# Patient Record
Sex: Female | Born: 1990 | Race: White | Hispanic: No | Marital: Single | Smoking: Current every day smoker
Health system: Southern US, Community
[De-identification: ages and names within clinical notes are randomized; demographics above are authoritative.]

## PROBLEM LIST (undated history)

## (undated) DIAGNOSIS — F99 Mental disorder, not otherwise specified: Secondary | ICD-10-CM

## (undated) DIAGNOSIS — B182 Chronic viral hepatitis C: Secondary | ICD-10-CM

## (undated) DIAGNOSIS — F1910 Other psychoactive substance abuse, uncomplicated: Secondary | ICD-10-CM

## (undated) DIAGNOSIS — F32A Depression, unspecified: Secondary | ICD-10-CM

## (undated) DIAGNOSIS — R569 Unspecified convulsions: Secondary | ICD-10-CM

## (undated) DIAGNOSIS — F329 Major depressive disorder, single episode, unspecified: Secondary | ICD-10-CM

## (undated) DIAGNOSIS — F1010 Alcohol abuse, uncomplicated: Secondary | ICD-10-CM

---

## 2008-02-16 ENCOUNTER — Emergency Department (HOSPITAL_BASED_OUTPATIENT_CLINIC_OR_DEPARTMENT_OTHER): Admission: EM | Admit: 2008-02-16 | Discharge: 2008-02-16 | Payer: Self-pay | Admitting: Emergency Medicine

## 2008-04-08 ENCOUNTER — Emergency Department (HOSPITAL_COMMUNITY): Admission: EM | Admit: 2008-04-08 | Discharge: 2008-04-08 | Payer: Self-pay | Admitting: Emergency Medicine

## 2008-04-22 ENCOUNTER — Emergency Department (HOSPITAL_COMMUNITY): Admission: EM | Admit: 2008-04-22 | Discharge: 2008-04-22 | Payer: Self-pay | Admitting: Emergency Medicine

## 2008-05-05 ENCOUNTER — Emergency Department (HOSPITAL_COMMUNITY): Admission: EM | Admit: 2008-05-05 | Discharge: 2008-05-05 | Payer: Self-pay | Admitting: Emergency Medicine

## 2008-05-31 ENCOUNTER — Emergency Department (HOSPITAL_COMMUNITY): Admission: EM | Admit: 2008-05-31 | Discharge: 2008-05-31 | Payer: Self-pay | Admitting: Emergency Medicine

## 2008-09-11 ENCOUNTER — Emergency Department (HOSPITAL_BASED_OUTPATIENT_CLINIC_OR_DEPARTMENT_OTHER): Admission: EM | Admit: 2008-09-11 | Discharge: 2008-09-12 | Payer: Self-pay | Admitting: Emergency Medicine

## 2008-09-20 ENCOUNTER — Emergency Department (HOSPITAL_BASED_OUTPATIENT_CLINIC_OR_DEPARTMENT_OTHER): Admission: EM | Admit: 2008-09-20 | Discharge: 2008-09-20 | Payer: Self-pay | Admitting: Emergency Medicine

## 2008-10-30 ENCOUNTER — Emergency Department (HOSPITAL_BASED_OUTPATIENT_CLINIC_OR_DEPARTMENT_OTHER): Admission: EM | Admit: 2008-10-30 | Discharge: 2008-10-30 | Payer: Self-pay | Admitting: Emergency Medicine

## 2009-01-21 ENCOUNTER — Ambulatory Visit: Payer: Self-pay | Admitting: Interventional Radiology

## 2009-01-21 ENCOUNTER — Emergency Department (HOSPITAL_BASED_OUTPATIENT_CLINIC_OR_DEPARTMENT_OTHER): Admission: EM | Admit: 2009-01-21 | Discharge: 2009-01-21 | Payer: Self-pay | Admitting: Emergency Medicine

## 2009-04-22 ENCOUNTER — Emergency Department (HOSPITAL_BASED_OUTPATIENT_CLINIC_OR_DEPARTMENT_OTHER): Admission: EM | Admit: 2009-04-22 | Discharge: 2009-04-23 | Payer: Self-pay | Admitting: Emergency Medicine

## 2009-04-23 ENCOUNTER — Ambulatory Visit: Payer: Self-pay | Admitting: Diagnostic Radiology

## 2009-05-06 ENCOUNTER — Ambulatory Visit: Payer: Self-pay | Admitting: Diagnostic Radiology

## 2009-05-06 ENCOUNTER — Emergency Department (HOSPITAL_BASED_OUTPATIENT_CLINIC_OR_DEPARTMENT_OTHER): Admission: EM | Admit: 2009-05-06 | Discharge: 2009-05-06 | Payer: Self-pay | Admitting: Emergency Medicine

## 2009-05-21 ENCOUNTER — Emergency Department (HOSPITAL_BASED_OUTPATIENT_CLINIC_OR_DEPARTMENT_OTHER): Admission: EM | Admit: 2009-05-21 | Discharge: 2009-05-21 | Payer: Self-pay | Admitting: Emergency Medicine

## 2009-05-21 ENCOUNTER — Ambulatory Visit: Payer: Self-pay | Admitting: Diagnostic Radiology

## 2009-06-02 ENCOUNTER — Emergency Department (HOSPITAL_COMMUNITY): Admission: EM | Admit: 2009-06-02 | Discharge: 2009-06-02 | Payer: Self-pay | Admitting: Emergency Medicine

## 2009-07-16 ENCOUNTER — Emergency Department (HOSPITAL_BASED_OUTPATIENT_CLINIC_OR_DEPARTMENT_OTHER): Admission: EM | Admit: 2009-07-16 | Discharge: 2009-07-17 | Payer: Self-pay | Admitting: Emergency Medicine

## 2009-07-17 ENCOUNTER — Other Ambulatory Visit: Payer: Self-pay | Admitting: Emergency Medicine

## 2009-07-18 ENCOUNTER — Inpatient Hospital Stay (HOSPITAL_COMMUNITY): Admission: AD | Admit: 2009-07-18 | Discharge: 2009-07-23 | Payer: Self-pay | Admitting: Psychiatry

## 2009-07-18 ENCOUNTER — Ambulatory Visit: Payer: Self-pay | Admitting: Psychiatry

## 2010-05-13 LAB — BASIC METABOLIC PANEL
BUN: 17 mg/dL (ref 6–23)
Calcium: 9.1 mg/dL (ref 8.4–10.5)
Creatinine, Ser: 0.9 mg/dL (ref 0.4–1.2)
Potassium: 4.1 mEq/L (ref 3.5–5.1)

## 2010-05-13 LAB — URINE CULTURE

## 2010-05-13 LAB — POCT I-STAT, CHEM 8
Calcium, Ion: 1.09 mmol/L — ABNORMAL LOW (ref 1.12–1.32)
Chloride: 107 mEq/L (ref 96–112)
Creatinine, Ser: 0.7 mg/dL (ref 0.4–1.2)
Glucose, Bld: 87 mg/dL (ref 70–99)
Potassium: 3.9 mEq/L (ref 3.5–5.1)

## 2010-05-13 LAB — POCT TOXICOLOGY PANEL
Cocaine: POSITIVE
Tetrahydrocannabinol: POSITIVE

## 2010-05-13 LAB — URINALYSIS, ROUTINE W REFLEX MICROSCOPIC
Bilirubin Urine: NEGATIVE
Bilirubin Urine: NEGATIVE
Hgb urine dipstick: NEGATIVE
Hgb urine dipstick: NEGATIVE
Ketones, ur: 15 mg/dL — AB
Specific Gravity, Urine: 1.034 — ABNORMAL HIGH (ref 1.005–1.030)
Specific Gravity, Urine: 1.015 (ref 1.005–1.030)
Urobilinogen, UA: 0.2 mg/dL (ref 0.0–1.0)
pH: 6 (ref 5.0–8.0)

## 2010-05-13 LAB — URINE MICROSCOPIC-ADD ON

## 2010-05-13 LAB — CBC
HCT: 41.5 % (ref 36.0–46.0)
RBC: 4.35 MIL/uL (ref 3.87–5.11)
RDW: 12.4 % (ref 11.5–15.5)
WBC: 8.9 10*3/uL (ref 4.0–10.5)

## 2010-05-13 LAB — POCT PREGNANCY, URINE: Preg Test, Ur: NEGATIVE

## 2010-05-15 LAB — DIFFERENTIAL
Basophils Absolute: 0 10*3/uL (ref 0.0–0.1)
Basophils Relative: 0 % (ref 0–1)
Eosinophils Absolute: 0.1 10*3/uL (ref 0.0–0.7)
Eosinophils Relative: 1 % (ref 0–5)
Neutrophils Relative %: 71 % (ref 43–77)

## 2010-05-15 LAB — COMPREHENSIVE METABOLIC PANEL
ALT: 15 U/L (ref 0–35)
Albumin: 4.2 g/dL (ref 3.5–5.2)
Chloride: 101 mEq/L (ref 96–112)
Creatinine, Ser: 0.84 mg/dL (ref 0.4–1.2)
Sodium: 136 mEq/L (ref 135–145)
Total Protein: 7.1 g/dL (ref 6.0–8.3)

## 2010-05-15 LAB — CBC
HCT: 38.5 % (ref 36.0–46.0)
Hemoglobin: 13.1 g/dL (ref 12.0–15.0)
MCHC: 34.1 g/dL (ref 30.0–36.0)
MCV: 97.7 fL (ref 78.0–100.0)
Platelets: 247 10*3/uL (ref 150–400)
RBC: 3.94 MIL/uL (ref 3.87–5.11)

## 2010-05-15 LAB — URINE CULTURE: Colony Count: >=100000

## 2010-05-15 LAB — URINALYSIS, ROUTINE W REFLEX MICROSCOPIC
Glucose, UA: NEGATIVE mg/dL
Hgb urine dipstick: NEGATIVE
Ketones, ur: 40 mg/dL — AB
Protein, ur: 30 mg/dL — AB
Urobilinogen, UA: 1 mg/dL (ref 0.0–1.0)

## 2010-05-15 LAB — POCT PREGNANCY, URINE: Preg Test, Ur: NEGATIVE

## 2010-05-15 LAB — URINE MICROSCOPIC-ADD ON

## 2010-05-31 LAB — URINALYSIS, ROUTINE W REFLEX MICROSCOPIC
Bilirubin Urine: NEGATIVE
Glucose, UA: NEGATIVE mg/dL
Ketones, ur: NEGATIVE mg/dL
Leukocytes, UA: NEGATIVE
Protein, ur: NEGATIVE mg/dL
pH: 5.5 (ref 5.0–8.0)

## 2010-05-31 LAB — URINE MICROSCOPIC-ADD ON

## 2010-06-02 LAB — DIFFERENTIAL
Basophils Absolute: 0 10*3/uL (ref 0.0–0.1)
Lymphocytes Relative: 27 % (ref 12–46)
Lymphs Abs: 2.8 10*3/uL (ref 0.7–4.0)
Monocytes Absolute: 0.8 10*3/uL (ref 0.1–1.0)
Neutro Abs: 6.5 10*3/uL (ref 1.7–7.7)

## 2010-06-02 LAB — BASIC METABOLIC PANEL
Calcium: 8.9 mg/dL (ref 8.4–10.5)
GFR calc non Af Amer: >60 mL/min (ref >60)
Glucose, Bld: 102 mg/dL — ABNORMAL HIGH (ref 70–99)
Sodium: 141 mEq/L (ref 135–145)

## 2010-06-02 LAB — URINALYSIS, ROUTINE W REFLEX MICROSCOPIC
Bilirubin Urine: NEGATIVE
Hgb urine dipstick: NEGATIVE
Nitrite: NEGATIVE
Specific Gravity, Urine: 1.025 (ref 1.005–1.030)
Specific Gravity, Urine: 1.034 — ABNORMAL HIGH (ref 1.005–1.030)
Urobilinogen, UA: 1 mg/dL (ref 0.0–1.0)
pH: 6 (ref 5.0–8.0)
pH: 7 (ref 5.0–8.0)

## 2010-06-02 LAB — CBC
Hemoglobin: 13.5 g/dL (ref 12.0–15.0)
Platelets: 222 10*3/uL (ref 150–400)
RDW: 12 % (ref 11.5–15.5)
WBC: 10.3 10*3/uL (ref 4.0–10.5)

## 2010-06-02 LAB — RPR: RPR Ser Ql: NONREACTIVE

## 2010-06-02 LAB — GC/CHLAMYDIA PROBE AMP, GENITAL: GC Probe Amp, Genital: NEGATIVE

## 2010-06-02 LAB — WET PREP, GENITAL: Yeast Wet Prep HPF POC: NONE SEEN

## 2010-06-02 LAB — PREGNANCY, URINE: Preg Test, Ur: NEGATIVE

## 2013-03-29 ENCOUNTER — Encounter (HOSPITAL_COMMUNITY): Payer: Self-pay | Admitting: Emergency Medicine

## 2013-03-29 ENCOUNTER — Emergency Department (HOSPITAL_COMMUNITY)
Admission: EM | Admit: 2013-03-29 | Discharge: 2013-03-30 | Disposition: A | Discharge status: Other Acute Inpatient Hospital | Payer: Medicaid Other | Source: Home / Self Care | Attending: Emergency Medicine | Admitting: Emergency Medicine

## 2013-03-29 DIAGNOSIS — B182 Chronic viral hepatitis C: Secondary | ICD-10-CM | POA: Diagnosis present

## 2013-03-29 DIAGNOSIS — F411 Generalized anxiety disorder: Secondary | ICD-10-CM | POA: Diagnosis present

## 2013-03-29 DIAGNOSIS — Z23 Encounter for immunization: Secondary | ICD-10-CM

## 2013-03-29 DIAGNOSIS — Z8619 Personal history of other infectious and parasitic diseases: Secondary | ICD-10-CM

## 2013-03-29 DIAGNOSIS — F17200 Nicotine dependence, unspecified, uncomplicated: Secondary | ICD-10-CM

## 2013-03-29 DIAGNOSIS — Z833 Family history of diabetes mellitus: Secondary | ICD-10-CM

## 2013-03-29 DIAGNOSIS — F1120 Opioid dependence, uncomplicated: Principal | ICD-10-CM | POA: Diagnosis present

## 2013-03-29 DIAGNOSIS — Z880 Allergy status to penicillin: Secondary | ICD-10-CM

## 2013-03-29 DIAGNOSIS — F329 Major depressive disorder, single episode, unspecified: Secondary | ICD-10-CM | POA: Diagnosis present

## 2013-03-29 DIAGNOSIS — F1110 Opioid abuse, uncomplicated: Secondary | ICD-10-CM | POA: Insufficient documentation

## 2013-03-29 DIAGNOSIS — Z3202 Encounter for pregnancy test, result negative: Secondary | ICD-10-CM

## 2013-03-29 DIAGNOSIS — F1910 Other psychoactive substance abuse, uncomplicated: Secondary | ICD-10-CM

## 2013-03-29 DIAGNOSIS — F1010 Alcohol abuse, uncomplicated: Secondary | ICD-10-CM

## 2013-03-29 DIAGNOSIS — F1210 Cannabis abuse, uncomplicated: Secondary | ICD-10-CM | POA: Diagnosis present

## 2013-03-29 DIAGNOSIS — F1994 Other psychoactive substance use, unspecified with psychoactive substance-induced mood disorder: Secondary | ICD-10-CM | POA: Diagnosis present

## 2013-03-29 DIAGNOSIS — F1020 Alcohol dependence, uncomplicated: Secondary | ICD-10-CM | POA: Diagnosis present

## 2013-03-29 DIAGNOSIS — G4700 Insomnia, unspecified: Secondary | ICD-10-CM | POA: Diagnosis present

## 2013-03-29 DIAGNOSIS — F1410 Cocaine abuse, uncomplicated: Secondary | ICD-10-CM | POA: Diagnosis present

## 2013-03-29 DIAGNOSIS — F410 Panic disorder [episodic paroxysmal anxiety] without agoraphobia: Secondary | ICD-10-CM | POA: Diagnosis present

## 2013-03-29 DIAGNOSIS — F4310 Post-traumatic stress disorder, unspecified: Secondary | ICD-10-CM | POA: Diagnosis present

## 2013-03-29 HISTORY — DX: Alcohol abuse, uncomplicated: F10.10

## 2013-03-29 HISTORY — DX: Chronic viral hepatitis C: B18.2

## 2013-03-29 HISTORY — DX: Depression, unspecified: F32.A

## 2013-03-29 HISTORY — DX: Mental disorder, not otherwise specified: F99

## 2013-03-29 HISTORY — DX: Other psychoactive substance abuse, uncomplicated: F19.10

## 2013-03-29 HISTORY — DX: Major depressive disorder, single episode, unspecified: F32.9

## 2013-03-29 LAB — COMPREHENSIVE METABOLIC PANEL
ALBUMIN: 4.1 g/dL (ref 3.5–5.2)
ALK PHOS: 64 U/L (ref 39–117)
ALT: 40 U/L — AB (ref 0–35)
AST: 28 U/L (ref 0–37)
BUN: 6 mg/dL (ref 6–23)
CALCIUM: 9.4 mg/dL (ref 8.4–10.5)
CO2: 29 mEq/L (ref 19–32)
Chloride: 95 mEq/L — ABNORMAL LOW (ref 96–112)
Creatinine, Ser: 0.7 mg/dL (ref 0.50–1.10)
GFR calc non Af Amer: >90 mL/min (ref >90)
GLUCOSE: 93 mg/dL (ref 70–99)
POTASSIUM: 3.5 meq/L — AB (ref 3.7–5.3)
SODIUM: 137 meq/L (ref 137–147)
TOTAL PROTEIN: 7.5 g/dL (ref 6.0–8.3)
Total Bilirubin: 0.8 mg/dL (ref 0.3–1.2)

## 2013-03-29 LAB — RAPID URINE DRUG SCREEN, HOSP PERFORMED
Amphetamines: NOT DETECTED
BARBITURATES: NOT DETECTED
BENZODIAZEPINES: NOT DETECTED
COCAINE: NOT DETECTED
OPIATES: POSITIVE — AB
TETRAHYDROCANNABINOL: POSITIVE — AB

## 2013-03-29 LAB — ACETAMINOPHEN LEVEL

## 2013-03-29 LAB — CBC
HCT: 39.2 % (ref 36.0–46.0)
Hemoglobin: 13.8 g/dL (ref 12.0–15.0)
MCH: 32.3 pg (ref 26.0–34.0)
MCHC: 35.2 g/dL (ref 30.0–36.0)
MCV: 91.8 fL (ref 78.0–100.0)
Platelets: 265 10*3/uL (ref 150–400)
RBC: 4.27 MIL/uL (ref 3.87–5.11)
RDW: 12 % (ref 11.5–15.5)
WBC: 9.5 10*3/uL (ref 4.0–10.5)

## 2013-03-29 LAB — ETHANOL

## 2013-03-29 LAB — SALICYLATE LEVEL: Salicylate Lvl: <2 mg/dL — ABNORMAL LOW (ref 2.8–20.0)

## 2013-03-29 LAB — POCT PREGNANCY, URINE: Preg Test, Ur: NEGATIVE

## 2013-03-29 MED ORDER — ALUM & MAG HYDROXIDE-SIMETH 200-200-20 MG/5ML PO SUSP: 30.0000 mL | ORAL | Status: DC | PRN

## 2013-03-29 MED ORDER — ZOLPIDEM TARTRATE 5 MG PO TABS: 5.0000 mg | ORAL_TABLET | Freq: Every evening | ORAL | Status: DC | PRN

## 2013-03-29 MED ORDER — LORAZEPAM 1 MG PO TABS: 1.0000 mg | ORAL_TABLET | Freq: Three times a day (TID) | ORAL | Status: DC | PRN

## 2013-03-29 MED ORDER — NICOTINE 21 MG/24HR TD PT24
21.0000 mg | MEDICATED_PATCH | Freq: Every day | TRANSDERMAL | Status: DC
  Administered 2013-03-29: 21 mg via TRANSDERMAL
  Filled 2013-03-29: qty 1

## 2013-03-29 MED ORDER — ACETAMINOPHEN 325 MG PO TABS: 650.0000 mg | ORAL_TABLET | ORAL | Status: DC | PRN

## 2013-03-29 MED ORDER — ONDANSETRON HCL 4 MG PO TABS: 4.0000 mg | ORAL_TABLET | Freq: Three times a day (TID) | ORAL | Status: DC | PRN

## 2013-03-29 MED ORDER — IBUPROFEN 200 MG PO TABS: 600.0000 mg | ORAL_TABLET | Freq: Three times a day (TID) | ORAL | Status: DC | PRN

## 2013-03-29 NOTE — Progress Notes (Signed)
   CARE MANAGEMENT ED NOTE 03/29/2013  Patient:  Epimenio FootRESNELL,Nazariah   Account Number:  [id number]  Date Initiated:  03/29/2013  Documentation initiated by:  Radford PaxFERRERO,Mallarie Voorhies  Subjective/Objective Assessment:   Patient presents to Ed with requesting detox from heroine.     Subjective/Objective Assessment Detail:   Pmhx of ETOH abuse and Hep C     Action/Plan:   Action/Plan Detail:   Anticipated DC Date:       Status Recommendation to Physician:   Result of Recommendation:    Other ED Services  Consult Working Plan    DC Planning Services  Other  PCP issues    Choice offered to / List presented to:            Status of service:  Completed, signed off  ED Comments:   ED Comments Detail:  Patient confirms she does not have a pcp or insurance. [facility] provided patient with a list of pcps who accept self pay patients, information regarding Medicaid and Affordable Care Act for insurance, number to inquire about the orange card (of which the patient has had in the past), list of discounted pharmacies and website needymeds.org for medication assistances, financila assistance in the community sucha as local churches and salvation army, urban ministries, and dental assistance for patients without insurance.  Patient thankful for resources.  No further EDCM need at this time.
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
## 2013-03-29 NOTE — ED Notes (Signed)
Pt has been changed and wanded by security.  

## 2013-03-29 NOTE — BH Assessment (Signed)
Consulted with EDP Dr. Allen to obtain clinicals prior to assessing patient.   Cystal Shannahan, MS, LCASA Assessment Counselor  

## 2013-03-29 NOTE — ED Provider Notes (Signed)
Medical screening examination/treatment/procedure(s) were performed by non-physician practitioner and as supervising physician I was immediately available for consultation/collaboration.    Celene KrasJon R Veola Cafaro, MD 03/29/13 2013

## 2013-03-29 NOTE — ED Provider Notes (Signed)
CSN: [id number]     Arrival date & time 03/29/13  1642 History   This chart was scribed for non-physician practitioner Junious Silk PA-C, working with Celene Kras, MD by Donne Anon, ED Scribe. This patient was seen in room [facility]TR4/[facility] and the patient's care was started at 1646.   First MD Initiated Contact with Patient 03/29/13 1646     Chief Complaint  Patient presents with  . Medical Clearance    requesting detox from heroin    The history is provided by the patient. No language interpreter was used.   HPI Comments: Paula Stephenson is a 23 y.o. female who presents to the Emergency Department requiring medical clearance. She is requesting detox from  heroin. She currently uses "quite a bit" of heroin 3-4x per day. She has been using heroin for 4 years. Her last detox from heroin was in May 2013 and was sober for 15 months. She also currently consumes alcohol, and her last drink was this morning. She normally drinks beer, and states she only occasionally uses it. She also smokes 1 pack of cigarettes per day.She denies SI or HI. She is seeking inpatient detox.     Past Medical History  Diagnosis Date  . Alcohol abuse   . Substance abuse   . Hep C w/o coma, chronic    History reviewed. No pertinent past surgical history. Family History  Problem Relation Age of Onset  . Diabetes Other   . Cancer Other    History  Substance Use Topics  . Smoking status: Current Every Day Smoker    Types: Cigarettes  . Smokeless tobacco: Not on file  . Alcohol Use: Yes   OB History   Grav Para Term Preterm Abortions TAB SAB Ect Mult Living                 Review of Systems  Psychiatric/Behavioral: Negative for suicidal ideas.       Substance abuse  All other systems reviewed and are negative.    Allergies  Penicillins  Home Medications  No current outpatient prescriptions on file.  BP 117/72  Pulse 97  Temp(Src) 98.4 F (36.9 C) (Oral)  Ht 4\' 11"  (1.499 m)  Wt 110 lb (49.896  kg)  BMI 22.21 kg/m2  SpO2 100%  LMP 03/29/2013  Physical Exam  Nursing note and vitals reviewed. Constitutional: She is oriented to person, place, and time. She appears well-developed and well-nourished. No distress.  HENT:  Head: Normocephalic and atraumatic.  Right Ear: External ear normal.  Left Ear: External ear normal.  Nose: Nose normal.  Mouth/Throat: Oropharynx is clear and moist.  Eyes: Conjunctivae are normal.  Neck: Normal range of motion.  Cardiovascular: Normal rate, regular rhythm and normal heart sounds.   Pulmonary/Chest: Effort normal and breath sounds normal. No stridor. No respiratory distress. She has no wheezes. She has no rales.  Abdominal: Soft. She exhibits no distension.  Musculoskeletal: Normal range of motion.  Neurological: She is alert and oriented to person, place, and time. She has normal strength.  Skin: Skin is warm and dry. She is not diaphoretic. No erythema.  Psychiatric: Her behavior is normal. Her affect is blunt. She expresses no homicidal and no suicidal ideation.    ED Course  Procedures (including critical care time) DIAGNOSTIC STUDIES: Oxygen Saturation is 100% on RA, normal by my interpretation.    COORDINATION OF CARE: 6:31 PM Discussed treatment plan which includes labs and consult with ACT team with pt at bedside  and pt agreed to plan.    Labs Review Labs Reviewed  COMPREHENSIVE METABOLIC PANEL - Abnormal; Notable for the following:    Potassium 3.5 (*)    Chloride 95 (*)    ALT 40 (*)    All other components within normal limits  SALICYLATE LEVEL - Abnormal; Notable for the following:    Salicylate Lvl <2.0 (*)    All other components within normal limits  URINE RAPID DRUG SCREEN (HOSP PERFORMED) - Abnormal; Notable for the following:    Opiates POSITIVE (*)    Tetrahydrocannabinol POSITIVE (*)    All other components within normal limits  ACETAMINOPHEN LEVEL  CBC  ETHANOL  POCT PREGNANCY, URINE   Imaging Review No  results found.  EKG Interpretation   None       MDM  No diagnosis found.  Patient presents to ED for detox from heroin. Patient reports she has failed outpatient detox and is requesting inpatient. Last time she was able to stay sober for 15 months. Will consult TTS to see if patient is candidate for inpatient detox. If not, patient may be d/c'd home. No SI/HI. Vital signs stable.   I personally performed the services described in this documentation, which was scribed in my presence. The recorded information has been reviewed and is accurate.     Mora BellmanHannah S Van Seymore, PA-C 03/29/13 2010

## 2013-03-29 NOTE — ED Notes (Signed)
Pt was brought to ED by friend,. Pt is AO x 3, and cooperative. Pt is requesting detox from all substances currently in her system,opiates, marijuana, alcohol, heroin.

## 2013-03-30 ENCOUNTER — Encounter (HOSPITAL_COMMUNITY): Payer: Self-pay | Admitting: Behavioral Health

## 2013-03-30 ENCOUNTER — Inpatient Hospital Stay (HOSPITAL_COMMUNITY)
Admission: AD | Admit: 2013-03-30 | Discharge: 2013-04-01 | DRG: 897 | Disposition: A | Discharge status: Home / Self Care | Payer: Medicaid Other | Source: Intra-hospital | Attending: Psychiatry | Admitting: Psychiatry

## 2013-03-30 DIAGNOSIS — F1120 Opioid dependence, uncomplicated: Secondary | ICD-10-CM

## 2013-03-30 DIAGNOSIS — F329 Major depressive disorder, single episode, unspecified: Secondary | ICD-10-CM | POA: Diagnosis present

## 2013-03-30 DIAGNOSIS — F411 Generalized anxiety disorder: Secondary | ICD-10-CM

## 2013-03-30 DIAGNOSIS — F3289 Other specified depressive episodes: Secondary | ICD-10-CM

## 2013-03-30 MED ORDER — HYDROXYZINE HCL 25 MG PO TABS: 25.0000 mg | ORAL_TABLET | Freq: Four times a day (QID) | ORAL | Status: DC | PRN

## 2013-03-30 MED ORDER — TRAZODONE HCL 100 MG PO TABS
100.0000 mg | ORAL_TABLET | Freq: Every evening | ORAL | Status: DC | PRN
  Administered 2013-03-30 – 2013-03-31 (×4): 100 mg via ORAL
  Filled 2013-03-30: qty 1
  Filled 2013-03-30: qty 28
  Filled 2013-03-30 (×2): qty 1
  Filled 2013-03-30: qty 28
  Filled 2013-03-30 (×6): qty 1

## 2013-03-30 MED ORDER — NAPROXEN 500 MG PO TABS
500.0000 mg | ORAL_TABLET | Freq: Two times a day (BID) | ORAL | Status: DC | PRN
  Administered 2013-03-30: 500 mg via ORAL
  Filled 2013-03-30: qty 1

## 2013-03-30 MED ORDER — CHLORDIAZEPOXIDE HCL 25 MG PO CAPS
25.0000 mg | ORAL_CAPSULE | Freq: Four times a day (QID) | ORAL | Status: DC | PRN
  Administered 2013-03-30 – 2013-03-31 (×2): 25 mg via ORAL
  Filled 2013-03-30 (×2): qty 1

## 2013-03-30 MED ORDER — ONDANSETRON 4 MG PO TBDP: 4.0000 mg | ORAL_TABLET | Freq: Four times a day (QID) | ORAL | Status: DC | PRN

## 2013-03-30 MED ORDER — METHOCARBAMOL 500 MG PO TABS
500.0000 mg | ORAL_TABLET | Freq: Three times a day (TID) | ORAL | Status: DC | PRN
  Administered 2013-03-31: 500 mg via ORAL
  Filled 2013-03-30: qty 1

## 2013-03-30 MED ORDER — CLONIDINE HCL 0.1 MG PO TABS
0.1000 mg | ORAL_TABLET | ORAL | Status: DC
  Filled 2013-03-30 (×4): qty 1

## 2013-03-30 MED ORDER — PNEUMOCOCCAL VAC POLYVALENT 25 MCG/0.5ML IJ INJ
0.5000 mL | INJECTION | INTRAMUSCULAR | Status: AC
  Administered 2013-03-31: 0.5 mL via INTRAMUSCULAR

## 2013-03-30 MED ORDER — LOPERAMIDE HCL 2 MG PO CAPS: 2.0000 mg | ORAL_CAPSULE | ORAL | Status: DC | PRN

## 2013-03-30 MED ORDER — CLONIDINE HCL 0.1 MG PO TABS
0.1000 mg | ORAL_TABLET | Freq: Four times a day (QID) | ORAL | Status: DC
  Administered 2013-03-30 – 2013-04-01 (×7): 0.1 mg via ORAL
  Filled 2013-03-30 (×10): qty 1

## 2013-03-30 MED ORDER — PROSIGHT PO TABS
1.0000 | ORAL_TABLET | Freq: Every day | ORAL | Status: DC
  Administered 2013-03-30 – 2013-04-01 (×3): 1 via ORAL
  Filled 2013-03-30 (×5): qty 1

## 2013-03-30 MED ORDER — ACETAMINOPHEN 325 MG PO TABS: 650.0000 mg | ORAL_TABLET | Freq: Four times a day (QID) | ORAL | Status: DC | PRN

## 2013-03-30 MED ORDER — MAGNESIUM HYDROXIDE 400 MG/5ML PO SUSP: 30.0000 mL | Freq: Every day | ORAL | Status: DC | PRN

## 2013-03-30 MED ORDER — SERTRALINE HCL 25 MG PO TABS
25.0000 mg | ORAL_TABLET | Freq: Every day | ORAL | Status: DC
  Administered 2013-03-30 – 2013-04-01 (×3): 25 mg via ORAL
  Filled 2013-03-30: qty 14
  Filled 2013-03-30 (×5): qty 1

## 2013-03-30 MED ORDER — TRAZODONE HCL 50 MG PO TABS
50.0000 mg | ORAL_TABLET | Freq: Every evening | ORAL | Status: DC | PRN
  Administered 2013-03-30: 50 mg via ORAL
  Filled 2013-03-30 (×5): qty 1

## 2013-03-30 MED ORDER — DICYCLOMINE HCL 20 MG PO TABS: 20.0000 mg | ORAL_TABLET | Freq: Four times a day (QID) | ORAL | Status: DC | PRN

## 2013-03-30 MED ORDER — NICOTINE 21 MG/24HR TD PT24
21.0000 mg | MEDICATED_PATCH | Freq: Every day | TRANSDERMAL | Status: DC
  Administered 2013-03-30 – 2013-03-31 (×2): 21 mg via TRANSDERMAL
  Filled 2013-03-30 (×5): qty 1

## 2013-03-30 MED ORDER — INFLUENZA VAC SPLIT QUAD 0.5 ML IM SUSP
0.5000 mL | INTRAMUSCULAR | Status: AC
  Administered 2013-03-31: 0.5 mL via INTRAMUSCULAR
  Filled 2013-03-30: qty 0.5

## 2013-03-30 MED ORDER — CLONIDINE HCL 0.1 MG PO TABS: 0.1000 mg | ORAL_TABLET | Freq: Every day | ORAL | Status: DC

## 2013-03-30 MED ORDER — GABAPENTIN 100 MG PO CAPS
100.0000 mg | ORAL_CAPSULE | Freq: Three times a day (TID) | ORAL | Status: DC
  Administered 2013-03-30 – 2013-04-01 (×5): 100 mg via ORAL
  Filled 2013-03-30 (×8): qty 1
  Filled 2013-03-30 (×2): qty 42
  Filled 2013-03-30: qty 1
  Filled 2013-03-30: qty 42

## 2013-03-30 MED ORDER — OCUVITE-LUTEIN PO CAPS: 1.0000 | ORAL_CAPSULE | Freq: Every day | ORAL | Status: DC

## 2013-03-30 MED ORDER — ALUM & MAG HYDROXIDE-SIMETH 200-200-20 MG/5ML PO SUSP: 30.0000 mL | ORAL | Status: DC | PRN

## 2013-03-30 MED ORDER — ENSURE COMPLETE PO LIQD
237.0000 mL | Freq: Two times a day (BID) | ORAL | Status: DC
  Administered 2013-03-30: 237 mL via ORAL

## 2013-03-30 NOTE — [facility] Group Notes (Signed)
[facility] LCSW Group Therapy  03/30/2013 2:38 PM  Type of Therapy:  Group Therapy  Participation Level:  Active  Participation Quality:  Attentive  Affect:  Appropriate  Cognitive:  Alert and Oriented  Insight:  Engaged  Engagement in Therapy:  Engaged  Modes of Intervention:  Confrontation, Discussion, Education, Exploration, Problem-solving, Rapport Building, Socialization and Support  Summary of Progress/Problems: Emotion Regulation: This group focused on both positive and negative emotion identification and allowed group members to process ways to identify feelings, regulate negative emotions, and find healthy ways to manage internal/external emotions. Group members were asked to reflect on a time when their reaction to an emotion led to a negative outcome and explored how alternative responses using emotion regulation would have benefited them. Group members were also asked to discuss a time when emotion regulation was utilized when a negative emotion was experienced. Paula Stephenson was attentive and engaged throughout today's therapy group. She shared that she struggles with rejection and anger. CSW asked Paula Stephenson to identify and emotion tied to rejection--"grief." Paula Stephenson explained that she is grieving over the loss of her relationship with the father of her child. "My child looks just like him. I can't even be a good mother right now because I see my ex in my son." Paula Stephenson shows improving insight and progress in the group setting AEB her ability to process how keeping a journal (free writing) and talking to her support network rather than isolating or abusing heroin will help her cope with this loss and talk through her feelings of anger and rejection.    Smart, [name] LCSWA  03/30/2013, 2:38 PM

## 2013-03-30 NOTE — [facility] Suicide Risk Assessment (Signed)
[facility] INPATIENT: Family/Significant Other Suicide Prevention Education   Suicide Prevention Education:  Education Completed; No one has been identified by the patient as the family member/significant other with whom the patient will be residing, and identified as the person(s) who will aid the patient in the event of a mental health crisis (suicidal ideations/suicide attempt).   Pt did not c/o SI at admission, nor have they endorsed SI during their stay here. SPE not required. SPI pamphlet provided to pt and he was encouraged to share information with his support network, ask questions, and talk about any concerns.   The suicide prevention education provided includes the following:  Suicide risk factors  Suicide prevention and interventions  National Suicide Hotline telephone number  [facility] assessment telephone number  [facility] Emergency Assistance 911  [facility] and/or Residential Mobile Crisis Unit telephone number  Reyes IvanChelsea Horton, Kentucky[place]CS[place] 03/30/2013  2:40 PM

## 2013-03-30 NOTE — Tx Team (Signed)
Initial Interdisciplinary Treatment Plan  PATIENT STRENGTHS: (choose at least two) Ability for insight Active sense of humor Capable of independent living  PATIENT STRESSORS: Marital or family conflict Substance abuse   PROBLEM LIST: Problem List/Patient Goals Date to be addressed Date deferred Reason deferred Estimated date of resolution  Substance abuse 03/30/13     Recent break-up  03/30/13                                                DISCHARGE CRITERIA:  Ability to meet basic life and health needs Adequate post-discharge living arrangements Improved stabilization in mood, thinking, and/or behavior Verbal commitment to aftercare and medication compliance Withdrawal symptoms are absent or subacute and managed without 24-hour nursing intervention  PRELIMINARY DISCHARGE PLAN: Attend aftercare/continuing care group Attend PHP/IOP  PATIENT/FAMIILY INVOLVEMENT: This treatment plan has been presented to and reviewed with the patient, Paula Stephenson, and/or family member.  The patient and family have been given the opportunity to ask questions and make suggestions.  [name] L 03/30/2013, 2:20 AM

## 2013-03-30 NOTE — ED Notes (Signed)
No acute distress noted.

## 2013-03-30 NOTE — Progress Notes (Signed)
D: Patient denies SI/HI/AVH.  She is  flat and blunted but assertive.  She has been using the phone and appears appropriate. Pt. Reports poor sleep and appetite and per self inventory rates her depression as 8/10 but no feelings of hopelessness reported.  A: Patient given emotional support from RN. Patient encouraged to come to staff with concerns and/or questions. Patient's medication routine continued. Patient's orders and plan of care reviewed.   R: Patient remains appropriate and cooperative. Will continue to monitor patient q15 minutes for safety.

## 2013-03-30 NOTE — [facility] Group Notes (Signed)
[facility] L[facility] Aftercare Dis[facility] Planning [facility] Note   03/30/2013 9:45 AM  Participation Quality: Appropriate   Mood/Affect:  Depressed and Flat  Depression Rating:  6  Anxiety Rating:  7  Thoughts of Suicide:  No Will you contract for safety?   NA  Current AVH:  No  Plan for Discharge/Comments:  Pt reports that she lives with her mother. She plans to move in with her grandmother at d/c "it is a better environment for me." [place] and does not have MH provider at this time.   Transportation Means: family member  Supports: grandmother/limited family supports  Counselling psychologistmart, OncologistHeather LCSWA

## 2013-03-30 NOTE — [facility] Suicide Risk Assessment (Signed)
Suicide Risk Assessment  Admission Assessment     Nursing information obtained from:  Patient Demographic factors:  Caucasian;Low socioeconomic status;Unemployed Current Mental Status:  NA Loss Factors:  Loss of significant relationship;Financial problems / change in socioeconomic status Historical Factors:  NA Risk Reduction Factors:  Responsible for children under 25 years of age;Sense of responsibility to family;Positive social support Total Time spent with patient: 45 minutes  CLINICAL FACTORS:   Depression:   Comorbid alcohol abuse/dependence Impulsivity Alcohol/Substance Abuse/Dependencies  Psychiatric Specialty Exam:     Blood pressure 111/79, pulse 113, temperature 97.6 F (36.4 C), temperature source Oral, resp. rate 16, height 5' 1.02" (1.55 m), weight 56.246 kg (124 lb), last menstrual period 03/29/2013.Body mass index is 23.41 kg/(m^2).  General Appearance: Fairly Groomed  Patent attorneyye Contact::  Fair  Speech:  Clear and Coherent  Volume:  Decreased  Mood:  Anxious, Depressed and worried  Affect:  Restricted  Thought Process:  Coherent and Goal Directed  Orientation:  Full (Time, Place, and Person)  Thought Content:  symptoms, worries, concerns  Suicidal Thoughts:  No  Homicidal Thoughts:  No  Memory:  Immediate;   Fair Recent;   Fair Remote;   Fair  Judgement:  Fair  Insight:  Present  Psychomotor Activity:  Restlessness  Concentration:  Fair  Recall:  FiservFair  Fund of Knowledge:Fair  Language: Fair  Akathisia:  No  Handed:    AIMS (if indicated):     Assets:  Desire for Improvement  Sleep:  Number of Hours: 2.5   Musculoskeletal: Strength & Muscle Tone: within normal limits Gait & Station: normal Patient leans: N/A  COGNITIVE FEATURES THAT CONTRIBUTE TO RISK:  Closed-mindedness Polarized thinking Thought constriction (tunnel vision)    SUICIDE RISK:   Mild:  Suicidal ideation of limited frequency, intensity, duration, and specificity.  There are no  identifiable plans, no associated intent, mild dysphoria and related symptoms, good self-control (both objective and subjective assessment), few other risk factors, and identifiable protective factors, including available and accessible social support.  PLAN OF CARE: Supportive approach/coping skills/relaspe prevention                              Address the co morbidities                              Resume and optimize response to psychotropic agents  I certify that inpatient services furnished can reasonably be expected to improve the patient's condition.  Leman Martinek A 03/30/2013, 4:20 PM
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
## 2013-03-30 NOTE — Progress Notes (Signed)
NUTRITION ASSESSMENT  Pt identified as at risk on the Malnutrition Screen Tool  INTERVENTION: 1. Educated patient on the importance of nutrition and encouraged intake of food and beverages. 2. Discussed weight goals. 3. Supplements: Ensure Complete po BID, each supplement provides 350 kcal and 13 grams of protein and MVI daily  NUTRITION DIAGNOSIS: Unintentional weight loss related to sub-optimal intake as evidenced by pt report.   Goal: Pt to meet >/= 90% of their estimated nutrition needs.  Monitor:  PO intake  Assessment:  Patient admitted with etoh and heroine abuse.  Patient states that she has a variable appetite and that she had not eaten in the past 4 days secondary to poor appetite.  Reports gaining to 171 lbs while in prison with loss in the last 7 months since her release.  Patient reports using drugs during this time and not eating well.  Will go days without eating.    Patient meets criteria for severe malnutrition related to social/environmental causes AEB a 27% weight loss in the past 7 months and intake <50% for greater than one month.      23 y.o. female  Height: Ht Readings from Last 1 Encounters:  03/30/13 5' 1.02" (1.55 m)    Weight: Wt Readings from Last 1 Encounters:  03/30/13 124 lb (56.246 kg)    Weight Hx: Wt Readings from Last 10 Encounters:  03/30/13 124 lb (56.246 kg)  03/29/13 110 lb (49.896 kg)    BMI:  Body mass index is 23.41 kg/(m^2). Pt meets criteria for normal weight based on current BMI.  Estimated Nutritional Needs: Kcal: 25-30 kcal/kg Protein: > 1 gram protein/kg Fluid: 1 ml/kcal  Diet Order: General Pt is also offered choice of unit snacks mid-morning and mid-afternoon.  Pt is eating as desired.   Lab results and medications reviewed.   Oran ReinLaura Jobe, RD, LDN Clinical Inpatient Dietitian Pager:  [phone] Weekend and after hours pager:  [phone]
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
## 2013-03-30 NOTE — Progress Notes (Signed)
Recreation Therapy Notes  Date: 02.04.2015 Time: 2:45pm Location: [address] Dayroom    Group Topic: Communication, Team Building, Problem Solving  Goal Area(s) Addresses:  Patient will effectively work with peer towards shared goal.  Patient will identify skill used to make activity successful.  Patient will identify how skills used during activity can be used to reach post d/c goals.   Behavioral Response: Appropriate.   Additional Comments: Patient arrived to group session in last 10 minutes. Peer explained activity to patient and patient listened intently to group discussion.    [name], LRT/CTRS  Jearl KlinefelterBlanchfield, Fallon Haecker L 03/30/2013 3:38 PM

## 2013-03-30 NOTE — Progress Notes (Signed)
Admission note: Per pt, she is seeking help to detox off of alcohol and heroin. Pt stated that she relapsed after a recent break-up with her daughter's father. Pt reports dating her Ex for four years and that they now have a 25 year old daughter. Pt reports using Heroin and couple of days ago and last consumed alcohol a day ago. Pt denies SI/HI/AVH at this time. Pt given PRN meds to help with withdrawal symptoms. Pt resting comfortably in bed.

## 2013-03-31 DIAGNOSIS — F329 Major depressive disorder, single episode, unspecified: Secondary | ICD-10-CM

## 2013-03-31 DIAGNOSIS — F1120 Opioid dependence, uncomplicated: Principal | ICD-10-CM

## 2013-03-31 DIAGNOSIS — F3289 Other specified depressive episodes: Secondary | ICD-10-CM

## 2013-03-31 DIAGNOSIS — F411 Generalized anxiety disorder: Secondary | ICD-10-CM

## 2013-03-31 NOTE — [facility] Group Notes (Signed)
[facility] LCSW Group Therapy  03/31/2013 3:22 PM  Type of Therapy:  Group Therapy  Participation Level:  Active  Participation Quality:  Attentive  Affect:  Appropriate  Cognitive:  Alert and Oriented  Insight:  Engaged  Engagement in Therapy:  Engaged  Modes of Intervention:  Confrontation, Discussion, Education, Exploration, Problem-solving, Rapport Building, Socialization and Support  Summary of Progress/Problems:  Finding Balance in Life. Today's group focused on defining balance in one's own words, identifying things that can knock one off balance, and exploring healthy ways to maintain balance in life. Group members were asked to provide an example of a time when they felt off balance, describe how they handled that situation,and process healthier ways to regain balance in the future. Group members were asked to share the most important tool for maintaining balance that they learned [facility] and how they plan to apply this method after discharge. Morrie Sheldonshley was attentive and engaged throughout today's therapy group. She shared that her relationship with her mother and her substance abuse have thrown her off balance. Morrie Sheldonshley talked about how her mother demands that she go to detox but smokes crack and abuses alcohol heavily. Morrie Sheldonshley demonstrates progress in the group setting AEB her ability to process how her tumultuous home environment and relationship are a trigger for her and tend to throw her off balance. Ahsley was able to identify what a balanced life would look like for her and explored the steps she must take to get there.    Smart, Issaih Kaus LCSWA  03/31/2013, 3:22 PM

## 2013-03-31 NOTE — Progress Notes (Signed)
[facility] MD Progress Note  03/31/2013 5:26 PM Paula Stephenson  MRN:  [id number] Subjective:  [name] is trying to get into a rehab program but would like to have some time to be with her daughter. States that she can stay in a safe place if she is discharged tomorrow while waiting for the rehab states she is committed to recovery as she realizes she is not going to be able to continue to do what she is doing. Concerned about what she is doing to herself and her daughter Diagnosis:   DSM5: Schizophrenia Disorders:  None Obsessive-Compulsive Disorders:  none Trauma-Stressor Disorders:  none Substance/Addictive Disorders:  Opioid Disorder - Moderate (304.00) Depressive Disorders:  Major Depressive Disorder - Moderate (296.22) Total Time spent with patient: 30 minutes  Axis I: Generalized Anxiety Disorder  ADL's:  Intact  Sleep: Fair  Appetite:  Fair  Suicidal Ideation:  Plan:  denies Intent:  denies Means:  denies Homicidal Ideation:  Plan:  denies Intent:  denies Means:  denies AEB (as evidenced by):  Psychiatric Specialty Exam: Physical Exam  Review of Systems  Constitutional: Negative.   HENT: Negative.   Eyes: Negative.   Respiratory: Negative.   Cardiovascular: Negative.   Gastrointestinal: Negative.   Genitourinary: Negative.   Musculoskeletal: Negative.   Skin: Negative.   Neurological: Negative.   Endo/Heme/Allergies: Negative.   Psychiatric/Behavioral: Positive for substance abuse. The patient is nervous/anxious.     Blood pressure 104/64, pulse 112, temperature 98.6 F (37 C), temperature source Oral, resp. rate 15, height 5' 1.02" (1.55 m), weight 56.246 kg (124 lb), last menstrual period 03/29/2013.Body mass index is 23.41 kg/(m^2).  General Appearance: Fairly Groomed  Engineer, water::  Fair  Speech:  Clear and Coherent  Volume:  Decreased  Mood:  Anxious and worried  Affect:  worried  Thought Process:  Coherent and Goal Directed  Orientation:  Full (Time,  Place, and Person)  Thought Content:  worries, cocerns, wants to be there wiht her daughter  Suicidal Thoughts:  No  Homicidal Thoughts:  No  Memory:  Immediate;   Fair Recent;   Fair Remote;   Fair  Judgement:  Fair  Insight:  Present  Psychomotor Activity:  Restlessness  Concentration:  Fair  Recall:  [employer] [place]: Fair  Akathisia:  No  Handed:    AIMS (if indicated):     Assets:  Desire for Improvement  Sleep:  Number of Hours: 5.75   Musculoskeletal: Strength & Muscle Tone: within normal limits Gait & Station: normal Patient leans: N/A  Current Medications: Current Facility-Administered Medications  Medication Dose Route Frequency Provider Last Rate Last Dose  . acetaminophen (TYLENOL) tablet 650 mg  650 mg Oral Q6H PRN Laverle Hobby, PA-C      . alum & mag hydroxide-simeth (MAALOX/MYLANTA) 200-200-20 MG/5ML suspension 30 mL  30 mL Oral Q4H PRN Laverle Hobby, PA-C      . chlordiazePOXIDE (LIBRIUM) capsule 25 mg  25 mg Oral QID PRN Laverle Hobby, PA-C   25 mg at 03/30/13 0209  . cloNIDine (CATAPRES) tablet 0.1 mg  0.1 mg Oral QID Laverle Hobby, PA-C   0.1 mg at 03/30/13 2105   Followed by  . [START ON 04/01/2013] cloNIDine (CATAPRES) tablet 0.1 mg  0.1 mg Oral BH-qamhs Spencer E Simon, PA-C       Followed by  . [START ON 04/04/2013] cloNIDine (CATAPRES) tablet 0.1 mg  0.1 mg Oral QAC breakfast Laverle Hobby, PA-C      .  dicyclomine (BENTYL) tablet 20 mg  20 mg Oral Q6H PRN Laverle Hobby, PA-C      . feeding supplement (ENSURE COMPLETE) (ENSURE COMPLETE) liquid 237 mL  237 mL Oral BID BM Darrol Jump, RD   237 mL at 03/30/13 1552  . gabapentin (NEURONTIN) capsule 100 mg  100 mg Oral TID Nicholaus Bloom, MD   100 mg at 03/31/13 1121  . hydrOXYzine (ATARAX/VISTARIL) tablet 25 mg  25 mg Oral Q6H PRN Nicholaus Bloom, MD      . loperamide (IMODIUM) capsule 2-4 mg  2-4 mg Oral PRN Laverle Hobby, PA-C      . magnesium hydroxide (MILK OF MAGNESIA)  suspension 30 mL  30 mL Oral Daily PRN Laverle Hobby, PA-C      . methocarbamol (ROBAXIN) tablet 500 mg  500 mg Oral Q8H PRN Laverle Hobby, PA-C      . multivitamin (PROSIGHT) tablet 1 tablet  1 tablet Oral Daily Nicholaus Bloom, MD   1 tablet at 03/31/13 1601  . naproxen (NAPROSYN) tablet 500 mg  500 mg Oral BID PRN Laverle Hobby, PA-C   500 mg at 03/30/13 0209  . nicotine (NICODERM CQ - dosed in mg/24 hours) patch 21 mg  21 mg Transdermal Q0600 Encarnacion Slates, NP   21 mg at 03/31/13 0825  . ondansetron (ZOFRAN-ODT) disintegrating tablet 4 mg  4 mg Oral Q6H PRN Laverle Hobby, PA-C      . sertraline (ZOLOFT) tablet 25 mg  25 mg Oral Daily Nicholaus Bloom, MD   25 mg at 03/31/13 0932  . traZODone (DESYREL) tablet 100 mg  100 mg Oral QHS,MR X 1 Nicholaus Bloom, MD   100 mg at 03/30/13 2243    Lab Results:  Results for orders placed during the hospital encounter of 03/29/13 (from the past 48 hour(s))  ACETAMINOPHEN LEVEL     Status: None   Collection Time    03/29/13  5:30 PM      Result Value Range   Acetaminophen (Tylenol), Serum <15.0  10 - 30 ug/mL   Comment:            THERAPEUTIC CONCENTRATIONS VARY     SIGNIFICANTLY. A RANGE OF 10-30     ug/mL MAY BE AN EFFECTIVE     CONCENTRATION FOR MANY PATIENTS.     HOWEVER, SOME ARE BEST TREATED     AT CONCENTRATIONS OUTSIDE THIS     RANGE.     ACETAMINOPHEN CONCENTRATIONS     >150 ug/mL AT 4 HOURS AFTER     INGESTION AND >50 ug/mL AT 12     HOURS AFTER INGESTION ARE     OFTEN ASSOCIATED WITH TOXIC     REACTIONS.  CBC     Status: None   Collection Time    03/29/13  5:30 PM      Result Value Range   WBC 9.5  4.0 - 10.5 K/uL   RBC 4.27  3.87 - 5.11 MIL/uL   Hemoglobin 13.8  12.0 - 15.0 g/dL   HCT 39.2  36.0 - 46.0 %   MCV 91.8  78.0 - 100.0 fL   MCH 32.3  26.0 - 34.0 pg   MCHC 35.2  30.0 - 36.0 g/dL   RDW 12.0  11.5 - 15.5 %   Platelets 265  150 - 400 K/uL  COMPREHENSIVE METABOLIC PANEL     Status: Abnormal   Collection Time  03/29/13  5:30 PM      Result Value Range   Sodium 137  137 - 147 mEq/L   Potassium 3.5 (*) 3.7 - 5.3 mEq/L   Chloride 95 (*) 96 - 112 mEq/L   CO2 29  19 - 32 mEq/L   Glucose, Bld 93  70 - 99 mg/dL   BUN 6  6 - 23 mg/dL   Creatinine, Ser 0.70  0.50 - 1.10 mg/dL   Calcium 9.4  8.4 - 10.5 mg/dL   Total Protein 7.5  6.0 - 8.3 g/dL   Albumin 4.1  3.5 - 5.2 g/dL   AST 28  0 - 37 U/L   ALT 40 (*) 0 - 35 U/L   Alkaline Phosphatase 64  39 - 117 U/L   Total Bilirubin 0.8  0.3 - 1.2 mg/dL   GFR calc non Af Amer >90  >90 mL/min   GFR calc Af Amer >90  >90 mL/min   Comment: (NOTE)     The eGFR has been calculated using the CKD EPI equation.     This calculation has not been validated in all clinical situations.     eGFR's persistently <90 mL/min signify possible Chronic Kidney     Disease.  ETHANOL     Status: None   Collection Time    03/29/13  5:30 PM      Result Value Range   Alcohol, Ethyl (B) <11  0 - 11 mg/dL   Comment:            LOWEST DETECTABLE LIMIT FOR     SERUM ALCOHOL IS 11 mg/dL     FOR MEDICAL PURPOSES ONLY  SALICYLATE LEVEL     Status: Abnormal   Collection Time    03/29/13  5:30 PM      Result Value Range   Salicylate Lvl <3.6 (*) 2.8 - 20.0 mg/dL  POCT PREGNANCY, URINE     Status: None   Collection Time    03/29/13  5:34 PM      Result Value Range   Preg Test, Ur NEGATIVE  NEGATIVE   Comment:            THE SENSITIVITY OF THIS     METHODOLOGY IS >24 mIU/mL    Physical Findings: AIMS: Facial and Oral Movements Muscles of Facial Expression: None, normal Lips and Perioral Area: None, normal Jaw: None, normal Tongue: None, normal,Extremity Movements Upper (arms, wrists, hands, fingers): None, normal Lower (legs, knees, ankles, toes): None, normal, Trunk Movements Neck, shoulders, hips: None, normal, Overall Severity Severity of abnormal movements (highest score from questions above): None, normal Incapacitation due to abnormal movements: None,  normal Patient's awareness of abnormal movements (rate only patient's report): No Awareness,    CIWA:  CIWA-Ar Total: 6 COWS:     Treatment Plan Summary: Daily contact with patient to assess and evaluate symptoms and progress in treatment Medication management  Plan: Supportive approach/coping skills/relapse prevention           Pursue detox           Reassess and address the co morbidities  Medical Decision Making Problem Points:  Review of psycho-social stressors (1) Data Points:  Review of medication regiment & side effects (2)  I certify that inpatient services furnished can reasonably be expected to improve the patient's condition.   Kanoa Phillippi A 03/31/2013, 5:26 PM

## 2013-03-31 NOTE — Progress Notes (Signed)
D. Pt has been up and has been visible in milieu today, attending and participating in various milieu activities. Pt spoke about how she may be getting discharged tomorrow and would like to spend some time with her daughter before she goes to long-term treatment. Pt spoke about the need to get clean and sober for herself as well as for her daughter. Pt has received all medications without incident. A. Support and encouragement provided, medication education given. R. Pt verbalized understanding, safety maintained.

## 2013-03-31 NOTE — Progress Notes (Signed)
[facility] Group Notes:  (Nursing/MHT/Case Management/Adjunct)  Date:  03/31/2013  Time:  2100 Type of Therapy:  wrap up group  Participation Level:  Active  Participation Quality:  Appropriate, Attentive, Sharing and Supportive  Affect:  Appropriate  Cognitive:  Appropriate  Insight:  Lacking  Engagement in Group:  Engaged  Modes of Intervention:  Clarification, Education and Support  Summary of Progress/Problems:Pt shared that she is discharging to her very christian grandmothers house who doesn't even have cable.  Pt is confident that she can stay clean there.  Pt was warned of the effects of boredom on relapse. Pt shared that her grandma has netflix and she will be fine. Also would like to get back to sewing. Pt shares that she had a good visit with her significant other, pt admits relationship is toxic.   Shelah LewandowskySquires, Adell Panek Carol 03/31/2013, 10:55 PM

## 2013-04-01 MED ORDER — HYDROXYZINE HCL 25 MG PO TABS: ORAL_TABLET | ORAL | Status: DC

## 2013-04-01 MED ORDER — SERTRALINE HCL 25 MG PO TABS: 25.0000 mg | ORAL_TABLET | Freq: Every day | ORAL | Status: DC

## 2013-04-01 MED ORDER — TRAZODONE HCL 100 MG PO TABS: 100.0000 mg | ORAL_TABLET | Freq: Every evening | ORAL | Status: DC | PRN

## 2013-04-01 MED ORDER — GABAPENTIN 100 MG PO CAPS: 100.0000 mg | ORAL_CAPSULE | Freq: Three times a day (TID) | ORAL | Status: DC

## 2013-04-01 MED ORDER — HYDROXYZINE HCL 25 MG PO TABS
25.0000 mg | ORAL_TABLET | Freq: Three times a day (TID) | ORAL | Status: DC
  Filled 2013-04-01 (×3): qty 42

## 2013-04-01 NOTE — [facility] Suicide Risk Assessment (Signed)
Suicide Risk Assessment  Discharge Assessment     Demographic Factors:  Adolescent or young adult and Caucasian  Total Time spent with patient: 45 minutes  Psychiatric Specialty Exam:     Blood pressure 107/75, pulse 64, temperature 97.7 F (36.5 C), temperature source Oral, resp. rate 16, height 5' 1.02" (1.55 m), weight 56.246 kg (124 lb), last menstrual period 03/29/2013.Body mass index is 23.41 kg/(m^2).  General Appearance: Fairly Groomed  Patent attorneyye Contact::  Fair  Speech:  Clear and Coherent  Volume:  Normal  Mood:  Euthymic  Affect:  Appropriate  Thought Process:  Coherent and Goal Directed  Orientation:  Full (Time, Place, and Person)  Thought Content:  relapse prevention plan  Suicidal Thoughts:  No  Homicidal Thoughts:  No  Memory:  Immediate;   Fair Recent;   Fair Remote;   Fair  Judgement:  Fair  Insight:  Present  Psychomotor Activity:  Normal  Concentration:  Fair  Recall:  [employer]  Fund of Knowledge:Fair  Language: Fair  Akathisia:  No  Handed:    AIMS (if indicated):     Assets:  Desire for Improvement  Sleep:  Number of Hours: 6.25    Musculoskeletal: Strength & Muscle Tone: within normal limits Gait & Station: normal Patient leans: N/A   Mental Status Per Nursing Assessment::   On Admission:  NA  Current Mental Status by Physician: No active S/S of withdrawal   Loss Factors: NA  Historical Factors: NA  Risk Reduction Factors:   Responsible for children under 25 years of age and Sense of responsibility to family  Continued Clinical Symptoms:  Depression:   Comorbid alcohol abuse/dependence Alcohol/Substance Abuse/Dependencies  Cognitive Features That Contribute To Risk:  Closed-mindedness Polarized thinking Thought constriction (tunnel vision)    Suicide Risk:  Minimal: No identifiable suicidal ideation.  Patients presenting with no risk factors but with morbid ruminations; may be classified as minimal risk based on the severity of the  depressive symptoms  Discharge Diagnoses:   AXIS I:  Opiate dependence, GAD, Depressive Disorder NOs AXIS II:  Deferred AXIS III:   Past Medical History  Diagnosis Date  . Alcohol abuse   . Substance abuse   . Hep C w/o coma, chronic   . Mental disorder   . Depression    AXIS IV:  other psychosocial or environmental problems AXIS V:  61-70 mild symptoms  Plan Of Care/Follow-up recommendations:  Activity:  as tolerated Diet:  regular Follow up outpatient basis Is patient on multiple antipsychotic therapies at discharge:  No   Has Patient had three or more failed trials of antipsychotic monotherapy by history:  No  Recommended Plan for Multiple Antipsychotic Therapies: NA    Kiko Ripp A 04/01/2013, 12:44 PM

## 2013-04-01 NOTE — Progress Notes (Signed)
Adult Psychoeducational Group Note  Date:  04/01/2013 Time:  12:54 PM  Group Topic/Focus:  Relapse Prevention Planning:   The focus of this group is to define relapse and discuss the need for planning to combat relapse.  Participation Level:  Did Not Attend  Additional Comments:  Pt was initially in group and asked to leave to lay down before lunch.  Caswell CorwinOwen, Neema Barreira C 04/01/2013, 12:54 PM

## 2013-04-01 NOTE — [facility] Group Notes (Signed)
[facility]   04/01/2013 8:45 AM  Participation Quality:  Alert, Appropriate and Oriented  Mood/Affect:  Calm  Depression Rating:  1  Anxiety Rating:  9 - positive anxiety about getting d/c and going home  Thoughts of Suicide:  Pt denies SI/HI  Will you contract for safety?   Yes  Current AVH:  Pt denies  Plan for Discharge/Comments:  Pt attended discharge planning group and actively participated in group.  CSW provided pt with today's workbook.  Pt reports feeling well today.  Pt states that she plans to stay with her grandmother upon d/c with her daughter.  CSW discussed the concern of a CPS report if pt leaves her daughter with her mom while she is using.  Pt verbalized understanding this.  [facility] and therapy and plans to work on getting into [facility] on her own.  No further needs voiced by pt at this time.    Transportation Means: Pt reports access to transportation - boyfriend or grandmother will pick pt up  Supports: No supports mentioned at this time  Reyes IvanChelsea Horton, LCSW 04/01/2013 9:20 AM

## 2013-04-01 NOTE — Progress Notes (Signed)
Patient ID: Paula Stephenson, female   DOB: 10/14/1990, 23 y.o.   MRN: [id number] Patient discharged per physician order; patient denies SI/HI and A/V hallucinations; patient received samples, prescriptions, and copy of AVS after it was reviewed; patient had no other questions or concerns at this time; patient left the unit ambulatory

## 2013-04-01 NOTE — Progress Notes (Signed)
[facility] :  Will you be returning to the same living situation after discharge: Yes,  returning home to grandmother's home At discharge, do you have transportation home?:Yes,  family will pick pt up Do you have the ability to pay for your medications:Yes,  provided pt with samples and prescriptions.  [facility].    Release of information consent forms completed and in the chart;  Patient's signature needed at discharge.  Patient to Follow up at: Follow-up Information   Follow up with [facility] On 04/04/2013. (Walk in on this date for hospital discharge appointment.  Walk in clinic is Monday - Friday 8 am - 3 pm.  They will than schedule you for medication management and therapy. )    [address] information:   [address] [place], [place] [address] Phone: [phone] Fax: [phone]      Patient denies SI/HI:   Yes,  denies SI/HI    Safety Planning and Suicide Prevention discussed:  Yes,  discussed with pt.  N/A to contact family/friend due to no SI on admission.  See suicide prevention education note.   Carmina MillerHorton, Winna Golla Nicole 04/01/2013, 10:16 AM

## 2013-04-01 NOTE — Discharge Summary (Signed)
Physician Discharge Summary Note  Patient:  Paula Stephenson is an 23 y.o., female MRN:  [id number] DOB:  01-24-1991 Patient phone:  [phone] (home)  Patient address:   [address] [address],  Total Time spent with patient: Greater than 30 minutes  Date of Admission:  03/30/2013  Date of Discharge: 04/01/13  Reason for Admission:  Opioid  Discharge Diagnoses: Active Problems:   Opiate dependence   GAD (generalized anxiety disorder)   Depressive disorder, not elsewhere classified   Psychiatric Specialty Exam: Physical Exam  Constitutional: She is oriented to person, place, and time. She appears well-developed.  HENT:  Head: Normocephalic.  Eyes: Pupils are equal, round, and reactive to light.  Neck: Normal range of motion.  Cardiovascular: Normal rate.   Respiratory: Effort normal.  GI: Soft.  Genitourinary:  Denies any problems in this area  Musculoskeletal: Normal range of motion.  Neurological: She is alert and oriented to person, place, and time.  Skin: Skin is warm and dry.  Psychiatric: Her speech is normal and behavior is normal. Judgment and thought content normal. Anxious: Stable. Cognition and memory are normal. Depressed: Stable.    Review of Systems  Constitutional: Negative.   HENT: Negative.   Eyes: Negative.   Cardiovascular: Negative.   Gastrointestinal: Negative.   Genitourinary: Negative.   Musculoskeletal: Negative.   Skin: Negative.   Neurological: Negative.   Endo/Heme/Allergies: Negative.   Psychiatric/Behavioral: Positive for substance abuse (Opioid dependence). Negative for suicidal ideas, hallucinations and memory loss. Depression: Stabilized with medication prior to discharge. The patient has insomnia (Stabilized with medication prior to discharge). Nervous/anxious: Stabilized with medication prior to discharge.     Blood pressure 107/75, pulse 64, temperature 97.7 F (36.5 C), temperature source Oral, resp. rate  16, height 5' 1.02" (1.55 m), weight 56.246 kg (124 lb), last menstrual period 03/29/2013.Body mass index is 23.41 kg/(m^2).  General Appearance: Casual and Fairly Groomed  Engineer, water::  Good  Speech:  Clear and Coherent  Volume:  Normal  Mood:  Euthymic  Affect:  Appropriate and Congruent  Thought Process:  Coherent and Intact  Orientation:  Full (Time, Place, and Person)  Thought Content:  Denies hallucinations  Suicidal Thoughts:  No  Homicidal Thoughts:  No  Memory:  Immediate;   Good Recent;   Good Remote;   Good  Judgement:  Good  Insight:  Present  Psychomotor Activity:  Normal  Concentration:  Good  Recall:  Good  Fund of Knowledge:Good  Language: Good  Akathisia:  No  Handed:  Right  AIMS (if indicated):     Assets:  Desire for Improvement  Sleep:  Number of Hours: 6.25    Past Psychiatric History: Diagnosis: Opioid Disorder - Severe (304.00), Generalized anxiety disorder  Hospitalizations: [facility]  Outpatient Care: [facility] [facility]  Substance Abuse Care: [facility]  Self-Mutilation: Denies  Suicidal Attempts: Denies  Violent Behaviors: Denies   Musculoskeletal: Strength & Muscle Tone: within normal limits Gait & Station: normal Patient leans: N/A  DSM5: Schizophrenia Disorders:  NA Obsessive-Compulsive Disorders:  NA Trauma-Stressor Disorders: Generalized anxiety disorder, Substance/Addictive Disorders:  Opioid Disorder - Severe (304.00) Depressive Disorders:  NA  Axis Diagnosis:   AXIS I:  Opioid Disorder - Severe (304.00), Generalized anxiety disorder AXIS II:  Deferred AXIS III:   Past Medical History  Diagnosis Date  . Alcohol abuse   . Substance abuse   . Hep C w/o coma, chronic   . Mental disorder   . Depression  AXIS IV:  other psychosocial or environmental problems and Opioid dependence AXIS V:  62  Level of Care:  OP  Hospital Course:  23 Y/O female who states she has been using heroin on and off since 2010. States she was here  in 2011, she was able to abstain for 2 years. States that the father of her daughter caused her relapse and that she went to prison for 15 months. While in prison she went to a 90 days program ([facility]) While there she was diagnosed with Hep C. States she wants to detox, abstain long term and get treatment for her Hep C. She wants to be healthy for herself and her daughter. Has a past history of heavy alcohol use as well as cocaine. Now she uses occasionally. She states she has depression and anxiety. Did the best on a combination of Zoloft, Neurontin, and Vistaril.   While in this hospital, Paula Stephenson received detox treatment for opiates and associated withdrawal symptoms. She also was enrolled in group counseling sessions/activties, including AA/NA meetings being held on this unit. She was counseled, taught and learned coping skills that should help her cope better after discharge and manage her substance abuse issues better.  Paula Stephenson also was ordered and received Sertraline 25 mg daily foe depression, Trazodone 100 mg Q bedtime for sleep. Hydroxyzine 25 mg 4 times daily for anxiety and Neurontin 100 mg three times daily for substance withdrawal symptoms.  Paula Stephenson presented no other medical issues and or concerns that required treatment and or monitoring. However, she was monitored closely throughout her hospital stay. Paula Stephenson tolerated her treatment regimen without any significant adverse effects and or reactions.  Paula Stephenson has completed detox treatment and her mood stabilized. She is currently being discharged to continue treatment on an outpatient basis. She will be receiving psychiatric services and medication management at the [facility] psychiatric clinic here in [place], [place]. She has been informed that this is a walk-in appointment. The address, date and contact information for this clinic provided for patient in writing. Upon discharge, she denies any SIHI, AVH, delusions and or paranoia. She left [facility] with all  personal belongings in no apparent distress. She was provided with 14 days worth supply samples of her [facility] discharge medications. Transportation per family.  Consults:  psychiatry  Significant Diagnostic Studies:  labs: CBC with diff, CMP, UDS toxicology tests, U/A, reports reviewed, stable  Discharge Vitals:   Blood pressure 107/75, pulse 64, temperature 97.7 F (36.5 C), temperature source Oral, resp. rate 16, height 5' 1.02" (1.55 m), weight 56.246 kg (124 lb), last menstrual period 03/29/2013. Body mass index is 23.41 kg/(m^2). Lab Results:   Results for orders placed during the hospital encounter of 03/29/13 (from the past 72 hour(s))  URINE RAPID DRUG SCREEN (HOSP PERFORMED)     Status: Abnormal   Collection Time    03/29/13  5:26 PM      Result Value Range   Opiates POSITIVE (*) NONE DETECTED   Cocaine NONE DETECTED  NONE DETECTED   Benzodiazepines NONE DETECTED  NONE DETECTED   Amphetamines NONE DETECTED  NONE DETECTED   Tetrahydrocannabinol POSITIVE (*) NONE DETECTED   Barbiturates NONE DETECTED  NONE DETECTED   Comment:            DRUG SCREEN FOR MEDICAL PURPOSES     ONLY.  IF CONFIRMATION IS NEEDED     FOR ANY PURPOSE, NOTIFY LAB     WITHIN 5 DAYS.  LOWEST DETECTABLE LIMITS     FOR URINE DRUG SCREEN     Drug Class       Cutoff (ng/mL)     Amphetamine      1000     Barbiturate      200     Benzodiazepine   810     Tricyclics       175     Opiates          300     Cocaine          300     THC              50  ACETAMINOPHEN LEVEL     Status: None   Collection Time    03/29/13  5:30 PM      Result Value Range   Acetaminophen (Tylenol), Serum <15.0  10 - 30 ug/mL   Comment:            THERAPEUTIC CONCENTRATIONS VARY     SIGNIFICANTLY. A RANGE OF 10-30     ug/mL MAY BE AN EFFECTIVE     CONCENTRATION FOR MANY PATIENTS.     HOWEVER, SOME ARE BEST TREATED     AT CONCENTRATIONS OUTSIDE THIS     RANGE.     ACETAMINOPHEN CONCENTRATIONS     >150 ug/mL  AT 4 HOURS AFTER     INGESTION AND >50 ug/mL AT 12     HOURS AFTER INGESTION ARE     OFTEN ASSOCIATED WITH TOXIC     REACTIONS.  CBC     Status: None   Collection Time    03/29/13  5:30 PM      Result Value Range   WBC 9.5  4.0 - 10.5 K/uL   RBC 4.27  3.87 - 5.11 MIL/uL   Hemoglobin 13.8  12.0 - 15.0 g/dL   HCT 39.2  36.0 - 46.0 %   MCV 91.8  78.0 - 100.0 fL   MCH 32.3  26.0 - 34.0 pg   MCHC 35.2  30.0 - 36.0 g/dL   RDW 12.0  11.5 - 15.5 %   Platelets 265  150 - 400 K/uL  COMPREHENSIVE METABOLIC PANEL     Status: Abnormal   Collection Time    03/29/13  5:30 PM      Result Value Range   Sodium 137  137 - 147 mEq/L   Potassium 3.5 (*) 3.7 - 5.3 mEq/L   Chloride 95 (*) 96 - 112 mEq/L   CO2 29  19 - 32 mEq/L   Glucose, Bld 93  70 - 99 mg/dL   BUN 6  6 - 23 mg/dL   Creatinine, Ser 0.70  0.50 - 1.10 mg/dL   Calcium 9.4  8.4 - 10.5 mg/dL   Total Protein 7.5  6.0 - 8.3 g/dL   Albumin 4.1  3.5 - 5.2 g/dL   AST 28  0 - 37 U/L   ALT 40 (*) 0 - 35 U/L   Alkaline Phosphatase 64  39 - 117 U/L   Total Bilirubin 0.8  0.3 - 1.2 mg/dL   GFR calc non Af Amer >90  >90 mL/min   GFR calc Af Amer >90  >90 mL/min   Comment: (NOTE)     The eGFR has been calculated using the CKD EPI equation.     This calculation has not been validated in all clinical situations.     eGFR's persistently <90 mL/min signify possible Chronic  Kidney     Disease.  ETHANOL     Status: None   Collection Time    03/29/13  5:30 PM      Result Value Range   Alcohol, Ethyl (B) <11  0 - 11 mg/dL   Comment:            LOWEST DETECTABLE LIMIT FOR     SERUM ALCOHOL IS 11 mg/dL     FOR MEDICAL PURPOSES ONLY  SALICYLATE LEVEL     Status: Abnormal   Collection Time    03/29/13  5:30 PM      Result Value Range   Salicylate Lvl <9.7 (*) 2.8 - 20.0 mg/dL  POCT PREGNANCY, URINE     Status: None   Collection Time    03/29/13  5:34 PM      Result Value Range   Preg Test, Ur NEGATIVE  NEGATIVE   Comment:            THE  SENSITIVITY OF THIS     METHODOLOGY IS >24 mIU/mL    Physical Findings: AIMS: Facial and Oral Movements Muscles of Facial Expression: None, normal Lips and Perioral Area: None, normal Jaw: None, normal Tongue: None, normal,Extremity Movements Upper (arms, wrists, hands, fingers): None, normal Lower (legs, knees, ankles, toes): None, normal, Trunk Movements Neck, shoulders, hips: None, normal, Overall Severity Severity of abnormal movements (highest score from questions above): None, normal Incapacitation due to abnormal movements: None, normal Patient's awareness of abnormal movements (rate only patient's report): No Awareness,    CIWA:  CIWA-Ar Total: 0 COWS:     Psychiatric Specialty Exam: See Psychiatric Specialty Exam and Suicide Risk Assessment completed by Attending Physician prior to discharge.  Discharge destination:  Home  Is patient on multiple antipsychotic therapies at discharge:  No   Has Patient had three or more failed trials of antipsychotic monotherapy by history:  No  Recommended Plan for Multiple Antipsychotic Therapies: NA     Medication List       Indication   gabapentin 100 MG capsule  Commonly known as:  NEURONTIN  Take 1 capsule (100 mg total) by mouth 3 (three) times daily. For substance withdrawal symptoms   Indication:  Substance withdrawal symptoms     hydrOXYzine 25 MG tablet  Commonly known as:  ATARAX/VISTARIL  Take 1 tablet (25 mg) three times daily for anxiety/tension   Indication:  Anxiety associated with Organic Disease, Tension     sertraline 25 MG tablet  Commonly known as:  ZOLOFT  Take 1 tablet (25 mg total) by mouth daily. For depression   Indication:  Major Depressive Disorder     traZODone 100 MG tablet  Commonly known as:  DESYREL  Take 1 tablet (100 mg total) by mouth at bedtime and may repeat dose one time if needed. For sleep   Indication:  Trouble Sleeping       Follow-up Information   Follow up with [facility] On  04/04/2013. (Walk in on this date for hospital discharge appointment.  Walk in clinic is Monday - Friday 8 am - 3 pm.  They will than schedule you for medication management and therapy. )    Contact information:   [address] [place] [address] Phone: [phone] Fax: [phone]     Follow-up recommendations:  Activity:  As tolerated Diet: As recommended by your primary care doctor. Keep all scheduled follow-up appointments as recommended. Continue to work your relapse prevention plan Comments: Take all your medications as  prescribed by your mental healthcare provider. Report any adverse effects and or reactions from your medicines to your outpatient provider promptly. Patient is instructed and cautioned to not engage in alcohol and or illegal drug use while on prescription medicines. In the event of worsening symptoms, patient is instructed to call the crisis hotline, 911 and or go to the nearest ED for appropriate evaluation and treatment of symptoms. Follow-up with your primary care provider for your other medical issues, concerns and or health care needs.   Total Discharge Time:  Greater than 30 minutes.  Signed: Encarnacion Slates, PMHNP, FNP-BC 04/01/2013, 4:06 PM Agree with assessment and plan Woodroe Chen. Sabra Heck, M.D.

## 2013-04-01 NOTE — Tx Team (Signed)
Interdisciplinary Treatment Plan Update (Adult)  Date: 04/01/2013  Time Reviewed:  9:45 AM  Progress in Treatment: Attending groups: Yes Participating in groups:  Yes Taking medication as prescribed:  Yes Tolerating medication:  Yes Family/Significant othe contact made: No, N/A Patient understands diagnosis:  Yes Discussing patient identified problems/goals with staff:  Yes Medical problems stabilized or resolved:  Yes Denies suicidal/homicidal ideation: Yes Issues/concerns per patient self-inventory:  Yes Other:  New problem(s) identified: N/A  Discharge Plan or Barriers: [facility] and [facility].  Pt plans to work on getting into [facility] on her own.    Reason for Continuation of Hospitalization: Stable to d/c today  Comments: N/A  Estimated length of stay: D/C today  For review of initial/current patient goals, please see plan of care.  Attendees: Patient:     Family:     Physician:  Dr. Dub MikesLugo 04/01/2013 10:13 AM   Nursing:   Burnetta SabinBrittany Tyson, RN 04/01/2013 10:13 AM   Clinical Social Worker:  Reyes Ivanhelsea Horton, LCSW 04/01/2013 10:13 AM   Other: Onnie BoerJennifer Clark, RN case manager 04/01/2013 10:13 AM   Other:  Trula SladeHeather Smart, LCSWA 04/01/2013 10:13 AM   Other:  Serena ColonelAggie Nwoko, NP 04/01/2013 10:13 AM   Other:     Other:    Other:    Other:    Other:    Other:    Other:     Scribe for Treatment Team:   Carmina MillerHorton, Velta Rockholt Nicole, 04/01/2013 , 10:13 AM

## 2013-04-06 NOTE — Progress Notes (Signed)
Patient Discharge Instructions:  After Visit Summary (AVS):   Faxed to:  04/06/13 Discharge Summary Note:   Faxed to:  04/06/13 Psychiatric Admission Assessment Note:   Faxed to:  04/06/13 Suicide Risk Assessment - Discharge Assessment:   Faxed to:  04/06/13 Faxed/Sent to the Next Level Care provider:  04/06/13 [facility] @ [phone]  Jerelene ReddenSheena E Mason, 04/06/2013, 4:24 PM

## 2013-05-22 ENCOUNTER — Encounter (HOSPITAL_COMMUNITY): Payer: Self-pay | Admitting: Emergency Medicine

## 2013-05-22 ENCOUNTER — Emergency Department (HOSPITAL_COMMUNITY)
Admission: EM | Admit: 2013-05-22 | Discharge: 2013-05-23 | Disposition: A | Discharge status: Home / Self Care | Payer: Medicaid Other | Attending: Emergency Medicine | Admitting: Emergency Medicine

## 2013-05-22 DIAGNOSIS — F1910 Other psychoactive substance abuse, uncomplicated: Secondary | ICD-10-CM

## 2013-05-22 DIAGNOSIS — Z792 Long term (current) use of antibiotics: Secondary | ICD-10-CM | POA: Insufficient documentation

## 2013-05-22 DIAGNOSIS — Z79899 Other long term (current) drug therapy: Secondary | ICD-10-CM | POA: Insufficient documentation

## 2013-05-22 DIAGNOSIS — Z880 Allergy status to penicillin: Secondary | ICD-10-CM | POA: Insufficient documentation

## 2013-05-22 DIAGNOSIS — Z3202 Encounter for pregnancy test, result negative: Secondary | ICD-10-CM | POA: Insufficient documentation

## 2013-05-22 DIAGNOSIS — F1120 Opioid dependence, uncomplicated: Secondary | ICD-10-CM | POA: Insufficient documentation

## 2013-05-22 DIAGNOSIS — IMO0002 Reserved for concepts with insufficient information to code with codable children: Secondary | ICD-10-CM | POA: Insufficient documentation

## 2013-05-22 DIAGNOSIS — F17200 Nicotine dependence, unspecified, uncomplicated: Secondary | ICD-10-CM | POA: Insufficient documentation

## 2013-05-22 DIAGNOSIS — Z8669 Personal history of other diseases of the nervous system and sense organs: Secondary | ICD-10-CM | POA: Insufficient documentation

## 2013-05-22 DIAGNOSIS — Z8619 Personal history of other infectious and parasitic diseases: Secondary | ICD-10-CM | POA: Insufficient documentation

## 2013-05-22 DIAGNOSIS — F1210 Cannabis abuse, uncomplicated: Secondary | ICD-10-CM | POA: Insufficient documentation

## 2013-05-22 DIAGNOSIS — F1410 Cocaine abuse, uncomplicated: Secondary | ICD-10-CM | POA: Insufficient documentation

## 2013-05-22 HISTORY — DX: Unspecified convulsions: R56.9

## 2013-05-22 LAB — RAPID URINE DRUG SCREEN, HOSP PERFORMED
Amphetamines: NOT DETECTED
Barbiturates: NOT DETECTED
Benzodiazepines: NOT DETECTED
Cocaine: POSITIVE — AB
OPIATES: POSITIVE — AB
Tetrahydrocannabinol: POSITIVE — AB

## 2013-05-22 LAB — CBC
HCT: 43.7 % (ref 36.0–46.0)
Hemoglobin: 14.5 g/dL (ref 12.0–15.0)
MCH: 31 pg (ref 26.0–34.0)
MCHC: 33.2 g/dL (ref 30.0–36.0)
MCV: 93.6 fL (ref 78.0–100.0)
PLATELETS: 290 10*3/uL (ref 150–400)
RBC: 4.67 MIL/uL (ref 3.87–5.11)
RDW: 13.3 % (ref 11.5–15.5)
WBC: 13.2 10*3/uL — ABNORMAL HIGH (ref 4.0–10.5)

## 2013-05-22 LAB — POC URINE PREG, ED: Preg Test, Ur: NEGATIVE

## 2013-05-22 MED ORDER — IBUPROFEN 200 MG PO TABS: 600.0000 mg | ORAL_TABLET | Freq: Three times a day (TID) | ORAL | Status: DC | PRN

## 2013-05-22 MED ORDER — ONDANSETRON HCL 4 MG PO TABS: 4.0000 mg | ORAL_TABLET | Freq: Three times a day (TID) | ORAL | Status: DC | PRN

## 2013-05-22 MED ORDER — LORAZEPAM 1 MG PO TABS
1.0000 mg | ORAL_TABLET | Freq: Three times a day (TID) | ORAL | Status: DC | PRN
  Administered 2013-05-23: 1 mg via ORAL
  Filled 2013-05-22: qty 1

## 2013-05-22 MED ORDER — NICOTINE 21 MG/24HR TD PT24: 21.0000 mg | MEDICATED_PATCH | Freq: Every day | TRANSDERMAL | Status: DC

## 2013-05-22 MED ORDER — ALUM & MAG HYDROXIDE-SIMETH 200-200-20 MG/5ML PO SUSP: 30.0000 mL | ORAL | Status: DC | PRN

## 2013-05-22 MED ORDER — ZOLPIDEM TARTRATE 5 MG PO TABS: 5.0000 mg | ORAL_TABLET | Freq: Every evening | ORAL | Status: DC | PRN

## 2013-05-22 NOTE — ED Notes (Signed)
Bed: [facility]PT3 Expected date:  Expected time:  Means of arrival:  Comments: 

## 2013-05-22 NOTE — ED Notes (Signed)
Pt presents with family requesting detox from cocaine and heroin. Pt last used today. Denies SI/HI

## 2013-05-22 NOTE — ED Provider Notes (Signed)
CSN: [id number]     Arrival date & time 05/22/13  2151 History  This chart was scribed for non-physician practitioner Paula Madura, PA-C working with Paula Stephenson. Paula Lamas, MD by Paula Stephenson, ED Scribe. This patient was seen in room WTR3/[facility]PT3 and the patient's care was started at 10:53 PM.     Chief Complaint  Patient presents with  . Medical Clearance   The history is provided by the patient. No language interpreter was used.   HPI Comments: Paula Stephenson is a 23 y.o. female who presents to the Emergency Department requesting detox from cocaine and heroin because she wants to take care of her 69 year old daughter. She has been using heroin for 4 years. She detoxed 1 month ago and started using again as soon as she left. She last used IV heroin today. She uses daily by injection. She endorses intermittent ETOH use to curb her cravings when she does not have any illicit drugs to use. She denies SI or HI. Boyfriend is present.   Past Medical History  Diagnosis Date  . Alcohol abuse   . Substance abuse   . Hep C w/o coma, chronic   . Mental disorder   . Depression   . Seizures    History reviewed. No pertinent past surgical history. Family History  Problem Relation Age of Onset  . Diabetes Other   . Cancer Other    History  Substance Use Topics  . Smoking status: Current Every Day Smoker    Types: Cigarettes  . Smokeless tobacco: Not on file  . Alcohol Use: Yes     Comment: Daily--40's and wine    OB History   Grav Para Term Preterm Abortions TAB SAB Ect Mult Living                 Review of Systems  Psychiatric/Behavioral: Negative for suicidal ideas.  All other systems reviewed and are negative.     Allergies  Penicillins  Home Medications   Current Outpatient Rx  Name  Route  Sig  Dispense  Refill  . ibuprofen (ADVIL,MOTRIN) 200 MG tablet   Oral   Take 800 mg by mouth every 6 (six) hours as needed (pain).         . metroNIDAZOLE (FLAGYL) 500 MG tablet    Oral   Take 500 mg by mouth 2 (two) times daily.         . valACYclovir (VALTREX) 1000 MG tablet   Oral   Take 1,000 mg by mouth 2 (two) times daily.          BP 124/68  Pulse 81  Temp(Src) 98 F (36.7 C) (Oral)  Resp 16  Ht 4\' 11"  (1.499 m)  Wt 123 lb (55.792 kg)  BMI 24.83 kg/m2  SpO2 98%  LMP 05/01/2013  Physical Exam  Nursing note and vitals reviewed. Constitutional: She is oriented to person, place, and time. She appears well-developed and well-nourished. No distress.  HENT:  Head: Normocephalic and atraumatic.  Eyes: Conjunctivae and EOM are normal. No scleral icterus.  Neck: Normal range of motion.  Cardiovascular: Normal rate, regular rhythm and normal heart sounds.   Pulmonary/Chest: Effort normal and breath sounds normal. No respiratory distress. She has no wheezes. She has no rales.  Abdominal: Soft. She exhibits no distension. There is no tenderness. There is no rebound and no guarding.  Musculoskeletal: Normal range of motion.  Neurological: She is alert and oriented to person, place, and time.  Skin: Skin  is warm and dry. No rash noted. She is not diaphoretic. No erythema. No pallor.  Injection site marks to b/l ACs  Psychiatric: Her speech is normal. She is agitated. Cognition and memory are normal. She expresses no homicidal and no suicidal ideation. She expresses no suicidal plans and no homicidal plans.    ED Course  Procedures (including critical care time) Medications  LORazepam (ATIVAN) tablet 1 mg (1 mg Oral Given 05/23/13 0039)  ibuprofen (ADVIL,MOTRIN) tablet 600 mg (not administered)  zolpidem (AMBIEN) tablet 5 mg (not administered)  nicotine (NICODERM CQ - dosed in mg/24 hours) patch 21 mg (not administered)  ondansetron (ZOFRAN) tablet 4 mg (not administered)  alum & mag hydroxide-simeth (MAALOX/MYLANTA) 200-200-20 MG/5ML suspension 30 mL (not administered)    DIAGNOSTIC STUDIES: Oxygen Saturation is 98% on RA, normal by my interpretation.     COORDINATION OF CARE: 11:07 PM- Discussed treatment plan with pt. Pt agrees to plan.   Labs Review Labs Reviewed  CBC - Abnormal; Notable for the following:    WBC 13.2 (*)    All other components within normal limits  COMPREHENSIVE METABOLIC PANEL - Abnormal; Notable for the following:    Potassium 3.5 (*)    Chloride 95 (*)    Glucose, Bld 144 (*)    All other components within normal limits  SALICYLATE LEVEL - Abnormal; Notable for the following:    Salicylate Lvl <2.0 (*)    All other components within normal limits  URINE RAPID DRUG SCREEN (HOSP PERFORMED) - Abnormal; Notable for the following:    Opiates POSITIVE (*)    Cocaine POSITIVE (*)    Tetrahydrocannabinol POSITIVE (*)    All other components within normal limits  ACETAMINOPHEN LEVEL  ETHANOL  POC URINE PREG, ED   Imaging Review No results found.   EKG Interpretation None      MDM   Final diagnoses:  Opiate dependence  Polysubstance abuse    Patient presents today for detox and placement in inpatient rehab for heroin abuse. Patient with hx of inpatient tx 1 month ago and states she started using "right after" she left. Patient has been medically cleared in the ED. [facility] and is awaiting placement at [facility] for polysubstance abuse. Pt is currently not having SI or HI and appears stable in NAD.  I personally performed the services described in this documentation, which was scribed in my presence. The recorded information has been reviewed and is accurate.   Filed Vitals:   05/22/13 2235  BP: 124/68  Pulse: 81  Temp: 98 F (36.7 C)  TempSrc: Oral  Resp: 16  Height: 4\' 11"  (1.499 m)  Weight: 123 lb (55.792 kg)  SpO2: 98%     Paula MaduraKelly Briani Maul, PA-C 05/23/13 0255

## 2013-05-23 ENCOUNTER — Encounter (HOSPITAL_COMMUNITY): Payer: Self-pay | Admitting: *Deleted

## 2013-05-23 LAB — COMPREHENSIVE METABOLIC PANEL
ALT: 27 U/L (ref 0–35)
AST: 24 U/L (ref 0–37)
Albumin: 3.7 g/dL (ref 3.5–5.2)
Alkaline Phosphatase: 72 U/L (ref 39–117)
BUN: 9 mg/dL (ref 6–23)
CALCIUM: 9.7 mg/dL (ref 8.4–10.5)
CO2: 26 meq/L (ref 19–32)
CREATININE: 0.83 mg/dL (ref 0.50–1.10)
Chloride: 95 mEq/L — ABNORMAL LOW (ref 96–112)
GFR calc non Af Amer: >90 mL/min (ref >90)
GLUCOSE: 144 mg/dL — AB (ref 70–99)
Potassium: 3.5 mEq/L — ABNORMAL LOW (ref 3.7–5.3)
Sodium: 138 mEq/L (ref 137–147)
TOTAL PROTEIN: 7.7 g/dL (ref 6.0–8.3)
Total Bilirubin: 0.6 mg/dL (ref 0.3–1.2)

## 2013-05-23 LAB — ACETAMINOPHEN LEVEL: Acetaminophen (Tylenol), Serum: <15 ug/mL (ref 10–30)

## 2013-05-23 LAB — SALICYLATE LEVEL

## 2013-05-23 LAB — ETHANOL: Alcohol, Ethyl (B): <11 mg/dL (ref 0–11)
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
## 2013-05-23 NOTE — ED Provider Notes (Signed)
Medical screening examination/treatment/procedure(s) were conducted as a shared visit with non-physician practitioner(s) and myself.  I personally evaluated the patient during the encounter.  Heroin and cocaine abuse with alcohol binging. Denies SI or HI. Tearful and wants treatment.   EKG Interpretation None       Glynn OctaveStephen Salimatou Simone, MD 05/23/13 1135

## 2013-05-23 NOTE — ED Notes (Signed)
[place] to [facility] [place] to be transported after 8 am. Contact number for [facility] [phone].

## 2013-05-23 NOTE — Progress Notes (Signed)
Pt requested to speak with CSW as patient did not want to go to [facility] due to distance. CSW and pt discussed options available, and provided supportive counseling. Pt reported she wanted to speak with her mother regarding the decision. Pt agreed with plan and was  transported by [facility] to [facility].   Byrd HesselbachKristen Issaac Shipper, LCSW [phone]  ED CSW 05/23/2013 940am

## 2013-05-23 NOTE — ED Notes (Signed)
Patient arrived to unit, tearful and with c/o of anxiety. Pt states she is ready to go through detox from heroin and multiple other drugs. Pt states she has a three year old at home and needs to be there. Pt lives with her mother who is supportive. Pt denies SI or plans to harm herself at this time.

## 2013-08-23 ENCOUNTER — Encounter (HOSPITAL_COMMUNITY): Payer: Self-pay | Admitting: Emergency Medicine

## 2013-08-23 ENCOUNTER — Emergency Department (HOSPITAL_COMMUNITY): Payer: Medicaid Other

## 2013-08-23 ENCOUNTER — Emergency Department (HOSPITAL_COMMUNITY)
Admission: EM | Admit: 2013-08-23 | Discharge: 2013-08-23 | Disposition: A | Discharge status: Home / Self Care | Payer: Medicaid Other | Attending: Emergency Medicine | Admitting: Emergency Medicine

## 2013-08-23 DIAGNOSIS — R0789 Other chest pain: Secondary | ICD-10-CM

## 2013-08-23 DIAGNOSIS — R0602 Shortness of breath: Secondary | ICD-10-CM | POA: Insufficient documentation

## 2013-08-23 DIAGNOSIS — F17200 Nicotine dependence, unspecified, uncomplicated: Secondary | ICD-10-CM | POA: Insufficient documentation

## 2013-08-23 DIAGNOSIS — Z792 Long term (current) use of antibiotics: Secondary | ICD-10-CM | POA: Insufficient documentation

## 2013-08-23 DIAGNOSIS — R071 Chest pain on breathing: Secondary | ICD-10-CM | POA: Insufficient documentation

## 2013-08-23 DIAGNOSIS — Z79899 Other long term (current) drug therapy: Secondary | ICD-10-CM | POA: Insufficient documentation

## 2013-08-23 DIAGNOSIS — Z8669 Personal history of other diseases of the nervous system and sense organs: Secondary | ICD-10-CM | POA: Insufficient documentation

## 2013-08-23 DIAGNOSIS — Z880 Allergy status to penicillin: Secondary | ICD-10-CM | POA: Insufficient documentation

## 2013-08-23 DIAGNOSIS — Z8659 Personal history of other mental and behavioral disorders: Secondary | ICD-10-CM | POA: Insufficient documentation

## 2013-08-23 MED ORDER — IBUPROFEN 600 MG PO TABS: 600.0000 mg | ORAL_TABLET | Freq: Four times a day (QID) | ORAL | Status: DC | PRN

## 2013-08-23 MED ORDER — IBUPROFEN 200 MG PO TABS
600.0000 mg | ORAL_TABLET | Freq: Once | ORAL | Status: AC
  Administered 2013-08-23: 600 mg via ORAL
  Filled 2013-08-23: qty 3
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
## 2013-08-23 NOTE — ED Notes (Signed)
Dr Yelverton at bedside.  

## 2013-08-23 NOTE — Discharge Instructions (Signed)
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
## 2023-01-09 ENCOUNTER — Ambulatory Visit
Admission: EM | Admit: 2023-01-09 | Discharge: 2023-01-09 | Disposition: A | Discharge status: Home / Self Care | Payer: 59 | Attending: Nurse Practitioner | Admitting: Nurse Practitioner

## 2023-01-09 DIAGNOSIS — N898 Other specified noninflammatory disorders of vagina: Secondary | ICD-10-CM | POA: Insufficient documentation
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
## 2023-01-09 NOTE — Discharge Instructions (Addendum)
Your vaginal exam showed vaginal discharge.  You have a vaginal swab pending.  You will be notified via MyChart of your results.  If there are any positive results.  You will be called by a nurse for the next steps with treatment recommendations if needed.  If symptoms persist or get worse you can follow-up with urgent care, gynecology or primary care provider

## 2023-01-09 NOTE — ED Provider Notes (Addendum)
[facility]    CSN: [id number] Arrival date & time: 01/09/23  4098      History   Chief Complaint Chief Complaint  Patient presents with   Foreign Body in Vagina    HPI Paula Stephenson is a 32 y.o. female.   HPI  She is in today for evaluation of foreign body in her vaginal area.  She reports that she was on her menstrual cycle and that she used a make-up sponge.  She did is currently experiencing some abdominal cramping. . Denies any pelvic pain or tenderness, amenorrhea irregular bleeding or prolonged heavy bleeding Denies ulcers or lesions.  Past Medical History:  Diagnosis Date   Alcohol abuse    Depression    Hep C w/o coma, chronic (HCC)    Mental disorder    Seizures (HCC)    Substance abuse [facility])     Patient Active Problem List   Diagnosis Date Noted   Opiate dependence (HCC) 03/30/2013   GAD (generalized anxiety disorder) 03/30/2013   Depressive disorder, not elsewhere classified 03/30/2013    No past surgical history on file.  OB History   No obstetric history on file.      Home Medications    Prior to Admission medications   Medication Sig Start Date End Date Taking? Authorizing Provider  ibuprofen (ADVIL,MOTRIN) 200 MG tablet Take 800 mg by mouth every 6 (six) hours as needed (pain).    [provider]  ibuprofen (ADVIL,MOTRIN) 600 MG tablet Take 1 tablet (600 mg total) by mouth every 6 (six) hours as needed. 08/23/13   Loren Racer, MD  metroNIDAZOLE (FLAGYL) 500 MG tablet Take 500 mg by mouth 2 (two) times daily.    [provider]  valACYclovir (VALTREX) 1000 MG tablet Take 1,000 mg by mouth 2 (two) times daily.    [provider]    Family History Family History  Problem Relation Age of Onset   Diabetes Other    Cancer Other     Social History Social History   Tobacco Use   Smoking status: Every Day    Types: Cigarettes  Substance Use Topics   Alcohol use: Yes    Comment: Daily--40's and  wine    Drug use: Yes    Types: Opium, Cocaine    Comment: heroin     Allergies   Penicillins   Review of Systems Review of Systems   Physical Exam Triage Vital Signs ED Triage Vitals  Encounter Vitals Group     BP 01/09/23 1021 116/79     Systolic BP Percentile --      Diastolic BP Percentile --      Pulse Rate 01/09/23 1021 92     Resp 01/09/23 1021 17     Temp 01/09/23 1021 98.6 F (37 C)     Temp Source 01/09/23 1021 Oral     SpO2 01/09/23 1021 95 %     Weight --      Height --      Head Circumference --      Peak Flow --      Pain Score 01/09/23 1020 6     Pain Loc --      Pain Education --      Exclude from Growth Chart --    No data found.  Updated Vital Signs BP 116/79 (BP Location: Right Arm)   Pulse 92   Temp 98.6 F (37 C) (Oral)   Resp 17   LMP 12/02/2022 (  Exact Date)   SpO2 95%   Visual Acuity Right Eye Distance:   Left Eye Distance:   Bilateral Distance:    Right Eye Near:   Left Eye Near:    Bilateral Near:     Physical Exam Exam conducted with a chaperone present.  Constitutional:      Appearance: She is normal weight.  HENT:     Head: Normocephalic.  Eyes:     Comments: Wearing sunglasses  Cardiovascular:     Rate and Rhythm: Normal rate.  Pulmonary:     Effort: Pulmonary effort is normal.  Genitourinary:    Exam position: Lithotomy position.     Vagina: No foreign body.     Cervix: Discharge present.  Musculoskeletal:        General: Normal range of motion.  Neurological:     Mental Status: She is alert.      UC Treatments / Results  Labs (all labs ordered are listed, but only abnormal results are displayed) Labs Reviewed  CERVICOVAGINAL ANCILLARY ONLY    EKG   Radiology No results found.  Procedures Procedures (including critical care time)  Medications Ordered in UC Medications - No data to display  Initial Impression / Assessment and Plan / UC Course  I have reviewed the triage vital signs and  the nursing notes.  Pertinent labs & imaging results that were available during my care of the patient were reviewed by me and considered in my medical decision making (see chart for details).     Foreign object of vagina Final Clinical Impressions(s) / UC Diagnoses   Final diagnoses:  Vaginal discharge     Discharge Instructions      Your vaginal exam showed vaginal discharge.  You have a vaginal swab pending.  You will be notified via MyChart of your results.  If there are any positive results.  You will be called by a nurse for the next steps with treatment recommendations if needed.  If symptoms persist or get worse you can follow-up with urgent care, gynecology or primary care provider    ED Prescriptions   None    PDMP not reviewed this encounter.   Barbette Merino, NP 01/09/23 1122    Barbette Merino, NP 01/09/23 1122

## 2023-01-12 ENCOUNTER — Telehealth (HOSPITAL_COMMUNITY): Payer: Self-pay | Admitting: Emergency Medicine

## 2023-01-12 LAB — CERVICOVAGINAL ANCILLARY ONLY
Bacterial Vaginitis (gardnerella): POSITIVE — AB
Candida Glabrata: NEGATIVE
Candida Vaginitis: NEGATIVE
Chlamydia: NEGATIVE
Comment: NEGATIVE
Comment: NEGATIVE
Comment: NEGATIVE
Comment: NEGATIVE
Comment: NEGATIVE
Comment: NORMAL
Neisseria Gonorrhea: NEGATIVE
Trichomonas: NEGATIVE

## 2023-01-12 MED ORDER — METRONIDAZOLE 500 MG PO TABS: 500.0000 mg | ORAL_TABLET | Freq: Two times a day (BID) | ORAL | 0 refills | Status: DC

## 2023-01-12 NOTE — Telephone Encounter (Signed)
Metronidazole for positive BV

## 2023-01-13 ENCOUNTER — Telehealth: Payer: 59 | Admitting: Family Medicine

## 2023-01-13 ENCOUNTER — Telehealth: Payer: 59 | Admitting: Physician Assistant

## 2023-01-13 DIAGNOSIS — K047 Periapical abscess without sinus: Secondary | ICD-10-CM

## 2023-01-13 DIAGNOSIS — R220 Localized swelling, mass and lump, head: Secondary | ICD-10-CM

## 2023-01-13 NOTE — Progress Notes (Signed)
Because fever, pain and swelling related to dental infection your condition warrants further evaluation and I recommend that you be seen in a face to face visit at the local urgent care.   NOTE: There will be NO CHARGE for this eVisit   If you are having a true medical emergency please call 911.

## 2023-01-13 NOTE — Progress Notes (Signed)
No show

## 2024-01-12 ENCOUNTER — Ambulatory Visit (HOSPITAL_COMMUNITY)
Admission: EM | Admit: 2024-01-12 | Discharge: 2024-01-12 | Disposition: A | Discharge status: Home / Self Care | Attending: Psychiatry | Admitting: Psychiatry

## 2024-01-12 DIAGNOSIS — F321 Major depressive disorder, single episode, moderate: Secondary | ICD-10-CM | POA: Insufficient documentation

## 2024-01-12 DIAGNOSIS — Z760 Encounter for issue of repeat prescription: Secondary | ICD-10-CM | POA: Insufficient documentation

## 2024-01-12 DIAGNOSIS — F1191 Opioid use, unspecified, in remission: Secondary | ICD-10-CM

## 2024-01-12 DIAGNOSIS — F411 Generalized anxiety disorder: Secondary | ICD-10-CM | POA: Insufficient documentation

## 2024-01-12 DIAGNOSIS — Z79899 Other long term (current) drug therapy: Secondary | ICD-10-CM | POA: Insufficient documentation

## 2024-01-12 DIAGNOSIS — Z652 Problems related to release from prison: Secondary | ICD-10-CM | POA: Insufficient documentation

## 2024-01-12 DIAGNOSIS — F1111 Opioid abuse, in remission: Secondary | ICD-10-CM | POA: Insufficient documentation

## 2024-01-12 MED ORDER — CITALOPRAM HYDROBROMIDE 20 MG PO TABS: 20.0000 mg | ORAL_TABLET | Freq: Every day | ORAL | 0 refills | Status: AC

## 2024-01-12 MED ORDER — HYDROXYZINE HCL 25 MG PO TABS: 25.0000 mg | ORAL_TABLET | Freq: Three times a day (TID) | ORAL | 0 refills | Status: AC | PRN

## 2024-01-12 NOTE — Discharge Instructions (Addendum)
 Patient is instructed prior to discharge to:  Take all medications as prescribed by his/her mental healthcare provider. Report any adverse effects and or reactions from the medicines to his/her outpatient provider promptly. Keep all scheduled appointments, to ensure that you are getting refills on time and to avoid any interruption in your medication.  If you are unable to keep an appointment call to reschedule.  Be sure to follow-up with resources and follow-up appointments provided.  Patient has been instructed & cautioned: To not engage in alcohol and or illegal drug use while on prescription medicines. In the event of worsening symptoms, patient is instructed to call the crisis hotline, 911 and or go to the nearest ED for appropriate evaluation and treatment of symptoms. To follow-up with his/her primary care provider for your other medical issues, concerns and or health care needs.    Suboxone/methadone treatment centers:  [facility] [place], [place]  [address], [place], [place] [address]  [facility]  [address] [place], [place] [address]  [facility]  [address], [place], [place] [address]  [facility] [place], [place] [address] [place],  [address]

## 2024-01-12 NOTE — ED Provider Notes (Signed)
 Behavioral Health Urgent Care Medical Screening Exam  Patient Name: Paula Stephenson MRN: [id number] Date of Evaluation: 01/12/24 Chief Complaint:  need refills Diagnosis:  Final diagnoses:  Opioid use disorder in remission  Current moderate episode of major depressive disorder, unspecified whether recurrent (HCC)  GAD (generalized anxiety disorder)   History of Present illness: Paula Stephenson is a 33 y.o. female who presents voluntarily, requesting for her prescriptions to be faxed from the [facility] jail.  Patient has past psychiatric history of MDD and GAD, and currently taking Celexa and hydroxyzine , wanting to restart Suboxone.  Patient is linear, logical, goal-directed, with pleasant attitude, euthymic mood and congruent affect.  Patient was released from jail yesterday, after being there for 6 months.  When patient was incarcerated, she was started on Suboxone, and has been using this for the first few months in jail, then subsequently weaned off of this when she needed to move to different county jail.  Patient has long significant history of IVDU, and has been using for 19 years.  Discussed with patient why she would like to resume Suboxone, if she is already being weaned off, and patient says she is afraid she will start using again now that she is out of jail.  Patient gives me paperwork contact her peer case manager, Charlie Gavel, [phone], however unable to reach him, after several attempts.  Discussed with patient that we would not be able to write her brand-new prescription for Suboxone, and patient says that she has prescription already from [facility] jail, but needs this done by us .  Patient is currently staying with a family friend, and is going to meet with her peer counselor later today and try to obtain a Medicaid card.  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  I  provided patient with paper prescription for both Celexa and hydroxyzine , and included locations of several nearby Suboxone/methadone clinics where she could obtain a prescription in the same day.  Patient expressed frustration that this cannot be done all here, though understandable.  Flowsheet Row ED from 01/12/2024 in [facility] from 01/09/2023 in [facility])  C-SSRS RISK CATEGORY No Risk No Risk   Psychiatric Specialty Exam Presentation  General Appearance:Appropriate for Environment  Eye Contact:Good  Speech:Clear and Coherent; Normal Rate  Speech Volume:Normal  Handedness:No data recorded  Mood and Affect  Mood: Euthymic  Affect: Congruent  Thought Process  Thought Processes: Coherent; Goal Directed  Descriptions of Associations:Intact  Orientation:Full (Time, Place and Person)  Thought Content:Logical    Hallucinations:None  Ideas of Reference:None  Suicidal Thoughts:No  Homicidal Thoughts:No  Sensorium  Memory: Immediate Good  Judgment: Good  Insight: Good  Executive Functions  Concentration: Good  Attention Span: Good  Recall: Good  Fund of Knowledge: Good  Language: Good  Psychomotor Activity  Psychomotor Activity: Normal  Assets  Assets: Communication Skills; Physical Health; Resilience; Desire for Improvement; Social Support  Sleep  Sleep: Fair  Number of hours: No data recorded  Physical Exam: Physical Exam ROS Blood pressure (!) 141/93, pulse (!) 120, temperature 99.7 F (37.6 C), temperature source Oral, resp. rate 18, SpO2 97%. There is no height or weight on file to calculate BMI.  Musculoskeletal: Strength & Muscle Tone: within normal limits Gait & Station: normal Patient leans: N/A  [facility] MSE Discharge Disposition for Follow up and Recommendations: Based on  my evaluation the patient does not appear to have an emergency medical  condition and can be discharged with resources and follow up care in outpatient services for [facility] residential rehabilitation.  Alfornia Light, DO 01/12/2024, 2:50 PM
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
